# Patient Record
Sex: Female | Born: 1985 | Race: Black or African American | Hispanic: No | Marital: Married | Smoking: Never smoker
Health system: Southern US, Community
[De-identification: ages and names within clinical notes are randomized; demographics above are authoritative.]

## PROBLEM LIST (undated history)

## (undated) ENCOUNTER — Inpatient Hospital Stay (HOSPITAL_COMMUNITY): Payer: Self-pay

## (undated) DIAGNOSIS — D649 Anemia, unspecified: Secondary | ICD-10-CM

## (undated) DIAGNOSIS — N390 Urinary tract infection, site not specified: Secondary | ICD-10-CM

## (undated) DIAGNOSIS — O0930 Supervision of pregnancy with insufficient antenatal care, unspecified trimester: Secondary | ICD-10-CM

## (undated) DIAGNOSIS — Z8619 Personal history of other infectious and parasitic diseases: Secondary | ICD-10-CM

## (undated) DIAGNOSIS — D219 Benign neoplasm of connective and other soft tissue, unspecified: Secondary | ICD-10-CM

## (undated) HISTORY — DX: Supervision of pregnancy with insufficient antenatal care, unspecified trimester: O09.30

## (undated) HISTORY — PX: NO PAST SURGERIES: SHX2092

## (undated) HISTORY — DX: Benign neoplasm of connective and other soft tissue, unspecified: D21.9

## (undated) HISTORY — DX: Personal history of other infectious and parasitic diseases: Z86.19

## (undated) HISTORY — DX: Anemia, unspecified: D64.9

---

## 2008-06-30 ENCOUNTER — Ambulatory Visit: Payer: Self-pay | Admitting: Nurse Practitioner

## 2008-06-30 DIAGNOSIS — R5383 Other fatigue: Secondary | ICD-10-CM

## 2008-06-30 DIAGNOSIS — R5381 Other malaise: Secondary | ICD-10-CM

## 2008-06-30 DIAGNOSIS — L740 Miliaria rubra: Secondary | ICD-10-CM

## 2008-06-30 DIAGNOSIS — J309 Allergic rhinitis, unspecified: Secondary | ICD-10-CM | POA: Insufficient documentation

## 2008-06-30 DIAGNOSIS — J45909 Unspecified asthma, uncomplicated: Secondary | ICD-10-CM | POA: Insufficient documentation

## 2008-06-30 DIAGNOSIS — K219 Gastro-esophageal reflux disease without esophagitis: Secondary | ICD-10-CM

## 2008-07-01 ENCOUNTER — Encounter (INDEPENDENT_AMBULATORY_CARE_PROVIDER_SITE_OTHER): Payer: Self-pay | Admitting: Nurse Practitioner

## 2008-07-01 DIAGNOSIS — D649 Anemia, unspecified: Secondary | ICD-10-CM | POA: Insufficient documentation

## 2008-07-01 LAB — CONVERTED CEMR LAB
HCT: 34 % — ABNORMAL LOW (ref 36.0–46.0)
MCHC: 35 g/dL (ref 30.0–36.0)
MCV: 85 fL (ref 78.0–100.0)
Platelets: 262 10*3/uL (ref 150–400)
RDW: 12.4 % (ref 11.5–15.5)
Retic Ct Pct: 1.3 % (ref 0.4–3.1)
WBC: 4.9 10*3/uL (ref 4.0–10.5)

## 2008-08-04 ENCOUNTER — Ambulatory Visit: Payer: Self-pay | Admitting: Nurse Practitioner

## 2008-08-04 DIAGNOSIS — L299 Pruritus, unspecified: Secondary | ICD-10-CM | POA: Insufficient documentation

## 2008-08-12 ENCOUNTER — Telehealth (INDEPENDENT_AMBULATORY_CARE_PROVIDER_SITE_OTHER): Payer: Self-pay | Admitting: Nurse Practitioner

## 2008-11-14 ENCOUNTER — Ambulatory Visit: Payer: Self-pay | Admitting: Nurse Practitioner

## 2008-11-14 ENCOUNTER — Encounter (INDEPENDENT_AMBULATORY_CARE_PROVIDER_SITE_OTHER): Payer: Self-pay | Admitting: Nurse Practitioner

## 2008-11-14 DIAGNOSIS — H6690 Otitis media, unspecified, unspecified ear: Secondary | ICD-10-CM | POA: Insufficient documentation

## 2008-11-14 DIAGNOSIS — R81 Glycosuria: Secondary | ICD-10-CM

## 2008-11-14 LAB — CONVERTED CEMR LAB
Basophils Absolute: 0.1 10*3/uL (ref 0.0–0.1)
Bilirubin Urine: NEGATIVE
Eosinophils Absolute: 0.2 10*3/uL (ref 0.0–0.7)
Eosinophils Relative: 3 % (ref 0–5)
GC Probe Amp, Genital: NEGATIVE
HCT: 34.7 % — ABNORMAL LOW (ref 36.0–46.0)
Hemoglobin: 12.2 g/dL (ref 12.0–15.0)
Ketones, urine, test strip: NEGATIVE
Lymphocytes Relative: 44 % (ref 12–46)
Lymphs Abs: 2.9 10*3/uL (ref 0.7–4.0)
MCV: 84.4 fL (ref 78.0–100.0)
Monocytes Absolute: 0.4 10*3/uL (ref 0.1–1.0)
Nitrite: NEGATIVE
RDW: 12.3 % (ref 11.5–15.5)
Specific Gravity, Urine: <1.005
Urobilinogen, UA: 0.2
WBC Urine, dipstick: NEGATIVE

## 2008-11-16 ENCOUNTER — Encounter (INDEPENDENT_AMBULATORY_CARE_PROVIDER_SITE_OTHER): Payer: Self-pay | Admitting: Nurse Practitioner

## 2008-11-17 ENCOUNTER — Encounter (INDEPENDENT_AMBULATORY_CARE_PROVIDER_SITE_OTHER): Payer: Self-pay | Admitting: Nurse Practitioner

## 2009-06-02 ENCOUNTER — Ambulatory Visit: Payer: Self-pay | Admitting: Nurse Practitioner

## 2009-06-02 LAB — CONVERTED CEMR LAB
Blood in Urine, dipstick: NEGATIVE
Nitrite: NEGATIVE
Protein, U semiquant: NEGATIVE
WBC Urine, dipstick: NEGATIVE

## 2009-06-07 ENCOUNTER — Ambulatory Visit: Payer: Self-pay | Admitting: *Deleted

## 2011-11-05 ENCOUNTER — Emergency Department (HOSPITAL_COMMUNITY)
Admission: EM | Admit: 2011-11-05 | Discharge: 2011-11-05 | Disposition: A | Discharge status: Home / Self Care | Payer: Self-pay | Attending: Emergency Medicine | Admitting: Emergency Medicine

## 2011-11-05 ENCOUNTER — Emergency Department (HOSPITAL_COMMUNITY): Payer: Self-pay

## 2011-11-05 DIAGNOSIS — R110 Nausea: Secondary | ICD-10-CM | POA: Insufficient documentation

## 2011-11-05 DIAGNOSIS — R5381 Other malaise: Secondary | ICD-10-CM | POA: Insufficient documentation

## 2011-11-05 DIAGNOSIS — R51 Headache: Secondary | ICD-10-CM | POA: Insufficient documentation

## 2011-11-05 DIAGNOSIS — R7309 Other abnormal glucose: Secondary | ICD-10-CM | POA: Insufficient documentation

## 2011-11-05 DIAGNOSIS — N390 Urinary tract infection, site not specified: Secondary | ICD-10-CM | POA: Insufficient documentation

## 2011-11-05 DIAGNOSIS — M25559 Pain in unspecified hip: Secondary | ICD-10-CM | POA: Insufficient documentation

## 2011-11-05 DIAGNOSIS — R509 Fever, unspecified: Secondary | ICD-10-CM | POA: Insufficient documentation

## 2011-11-05 DIAGNOSIS — R000 Tachycardia, unspecified: Secondary | ICD-10-CM | POA: Insufficient documentation

## 2011-11-05 LAB — COMPREHENSIVE METABOLIC PANEL
AST: 24 U/L (ref 0–37)
Albumin: 3.8 g/dL (ref 3.5–5.2)
Calcium: 9.4 mg/dL (ref 8.4–10.5)
Creatinine, Ser: 0.77 mg/dL (ref 0.50–1.10)

## 2011-11-05 LAB — PREGNANCY, URINE: Preg Test, Ur: NEGATIVE

## 2011-11-05 LAB — DIFFERENTIAL
Basophils Absolute: 0 10*3/uL (ref 0.0–0.1)
Basophils Relative: 0 % (ref 0–1)
Eosinophils Relative: 0 % (ref 0–5)
Monocytes Absolute: 1.8 10*3/uL — ABNORMAL HIGH (ref 0.1–1.0)
Neutro Abs: 9.7 10*3/uL — ABNORMAL HIGH (ref 1.7–7.7)

## 2011-11-05 LAB — URINALYSIS, ROUTINE W REFLEX MICROSCOPIC
Glucose, UA: >1000 mg/dL — AB
Protein, ur: 30 mg/dL — AB
pH: 6 (ref 5.0–8.0)

## 2011-11-05 LAB — URINE MICROSCOPIC-ADD ON

## 2011-11-05 LAB — CBC
HCT: 30.6 % — ABNORMAL LOW (ref 36.0–46.0)
MCHC: 35.6 g/dL (ref 30.0–36.0)
MCV: 86.2 fL (ref 78.0–100.0)
RDW: 12.8 % (ref 11.5–15.5)

## 2011-11-05 MED ORDER — ALBUTEROL SULFATE HFA 108 (90 BASE) MCG/ACT IN AERS
2.0000 | INHALATION_SPRAY | Freq: Once | RESPIRATORY_TRACT | Status: AC
  Administered 2011-11-05: 2 via RESPIRATORY_TRACT
  Filled 2011-11-05: qty 6.7

## 2011-11-05 MED ORDER — LIDOCAINE HCL (PF) 1 % IJ SOLN
INTRAMUSCULAR | Status: AC
  Administered 2011-11-05: 5 mL
  Filled 2011-11-05: qty 5

## 2011-11-05 MED ORDER — HYDROCODONE-ACETAMINOPHEN 5-325 MG PO TABS: 2.0000 | ORAL_TABLET | ORAL | Status: AC | PRN

## 2011-11-05 MED ORDER — ONDANSETRON HCL 4 MG PO TABS: 4.0000 mg | ORAL_TABLET | Freq: Four times a day (QID) | ORAL | Status: AC

## 2011-11-05 MED ORDER — CEPHALEXIN 500 MG PO CAPS: 500.0000 mg | ORAL_CAPSULE | Freq: Four times a day (QID) | ORAL | Status: AC

## 2011-11-05 MED ORDER — CEFTRIAXONE SODIUM 1 G IJ SOLR
1.0000 g | Freq: Once | INTRAMUSCULAR | Status: AC
  Administered 2011-11-05: 1 g via INTRAMUSCULAR
  Filled 2011-11-05: qty 10

## 2011-11-05 NOTE — ED Notes (Signed)
Called patient in waiting room no answer.  

## 2011-11-05 NOTE — ED Notes (Signed)
Patient ambulated to X-ray 

## 2011-11-05 NOTE — ED Provider Notes (Signed)
History     CSN: [id number] Arrival date & time: 11/05/2011  2:29 PM   First MD Initiated Contact with Patient 11/05/11 1923      Chief Complaint  Patient presents with  . Hip Pain    (Consider location/radiation/quality/duration/timing/severity/associated sxs/prior treatment) HPI Comments: Patient here with multiple complaints - states that starting last week she began to feel chills, states thatt then she felt like there was something moving all around her body, states headache, dizziness, and hip pain - states that she has a remote history of Malaria from [place] but would like to be tested for "worms" - she does not know how high her fever was.  Denies vomiting, denies seeing worms in stool.  Patient is a 25 y.o. female presenting with hip pain. The history is provided by the patient. No language interpreter was used.  Hip Pain This is a new problem. The current episode started in the past 7 days. The problem occurs intermittently. The problem has been unchanged. Associated symptoms include fatigue, a fever, headaches and nausea. Pertinent negatives include no abdominal pain, arthralgias, chest pain, congestion, coughing, myalgias, neck pain, numbness, sore throat, swollen glands, visual change, vomiting or weakness. The symptoms are aggravated by nothing. She has tried nothing for the symptoms. The treatment provided no relief.    Past Medical History  Diagnosis Date  . Asthma     History reviewed. No pertinent past surgical history.  No family history on file.  History  Substance Use Topics  . Smoking status: Never Smoker   . Smokeless tobacco: Not on file  . Alcohol Use: No    OB History    Grav Para Term Preterm Abortions TAB SAB Ect Mult Living                  Review of Systems  Constitutional: Positive for fever and fatigue.  HENT: Negative for congestion, sore throat and neck pain.   Respiratory: Negative for cough.   Cardiovascular: Negative for chest  pain.  Gastrointestinal: Positive for nausea. Negative for vomiting and abdominal pain.  Musculoskeletal: Negative for myalgias and arthralgias.  Neurological: Positive for headaches. Negative for weakness and numbness.  All other systems reviewed and are negative.    Allergies  Review of patient's allergies indicates no known allergies.  Home Medications   Current Outpatient Rx  Name Route Sig Dispense Refill  . OMEGA-3 FATTY ACIDS 1000 MG PO CAPS Oral Take 1 g by mouth daily.      . ADULT MULTIVITAMIN W/MINERALS CH Oral Take 1 tablet by mouth daily.        BP 111/69  Pulse 114  Temp(Src) 99.5 F (37.5 C) (Oral)  Resp 20  Ht 5\' 3"  (1.6 m)  Wt 120 lb (54.432 kg)  BMI 21.26 kg/m2  SpO2 99%  LMP 10/04/2011  Physical Exam  Nursing note and vitals reviewed. Constitutional: She is oriented to person, place, and time. She appears well-developed and well-nourished. No distress.  HENT:  Head: Normocephalic and atraumatic.  Right Ear: External ear normal.  Left Ear: External ear normal.  Mouth/Throat: Oropharynx is clear and moist. No oropharyngeal exudate.  Eyes: Conjunctivae are normal. Pupils are equal, round, and reactive to light. No scleral icterus.  Neck: Normal range of motion. Neck supple.  Cardiovascular: Normal heart sounds and intact distal pulses.  Exam reveals no gallop and no friction rub.   No murmur heard.      tachycardia  Pulmonary/Chest: Effort normal and breath sounds  normal. No respiratory distress. She has no wheezes.  Abdominal: Soft. Bowel sounds are normal. She exhibits no distension. There is no tenderness.  Musculoskeletal: Normal range of motion.  Lymphadenopathy:    She has no cervical adenopathy.  Neurological: She is alert and oriented to person, place, and time. No cranial nerve deficit.  Skin: Skin is warm and dry.  Psychiatric: She has a normal mood and affect. Her behavior is normal. Judgment and thought content normal.    ED Course    Procedures (including critical care time)  Labs Reviewed  CBC - Abnormal; Notable for the following:    WBC 13.4 (*)    RBC 3.55 (*)    Hemoglobin 10.9 (*)    HCT 30.6 (*)    All other components within normal limits  DIFFERENTIAL - Abnormal; Notable for the following:    Neutro Abs 9.7 (*)    Monocytes Relative 13 (*)    Monocytes Absolute 1.8 (*)    All other components within normal limits  COMPREHENSIVE METABOLIC PANEL - Abnormal; Notable for the following:    Potassium 3.2 (*)    Glucose, Bld 152 (*)    All other components within normal limits  URINALYSIS, ROUTINE W REFLEX MICROSCOPIC - Abnormal; Notable for the following:    APPearance HAZY (*)    Glucose, UA >1000 (*)    Hgb urine dipstick MODERATE (*)    Ketones, ur 15 (*)    Protein, ur 30 (*)    Leukocytes, UA LARGE (*)    All other components within normal limits  URINE MICROSCOPIC-ADD ON - Abnormal; Notable for the following:    Squamous Epithelial / LPF FEW (*)    Bacteria, UA FEW (*)    All other components within normal limits  LIPASE, BLOOD  PREGNANCY, URINE  URINE CULTURE   Dg Abd Acute W/chest  11/05/2011  *RADIOLOGY REPORT*  Clinical Data: Abdominal pain.  ACUTE ABDOMEN SERIES (ABDOMEN 2 VIEW & CHEST 1 VIEW)  Comparison:  None.  Findings:  There is no evidence of dilated bowel loops or free intraperitoneal air.  No radiopaque calculi or other significant radiographic abnormality is seen. Heart size and mediastinal contours are within normal limits.  Both lungs are clear.  IMPRESSION: Negative abdominal radiographs.  No acute cardiopulmonary disease.  Original Report Authenticated By: Reola Calkins, M.D.     UTI  Fever hyperglycemia    MDM  Patient reports feeling better now that fever is down - remains slighly tachycardic.  Though I have a low suspicion for malaria, since she is from an endemic area, I will send the malaria smear.  I also have a low suspicion for worms but will order an ova and  parasite for outpatient on the patient.  Patient also noted to have high blood sugar and glucose in urine, ? DM - she will follow up with PCP this coming week to make sure her sugars are down.  She also has a UTI which is more likely the cause to the fever, chills, nausea and headaches, I have sent a culture on this and will begin treatment here.        Izola Price [place], [place] 11/05/11 2255

## 2011-11-05 NOTE — ED Notes (Signed)
Pt. Believes there is something moving around her hips. Having chills. Believes something is moving all over her body, unsteady gait, nauseated,. Headache, dizziness, tight throat.

## 2011-11-06 LAB — MALARIA SMEAR: Special Requests: NORMAL

## 2011-11-06 NOTE — ED Provider Notes (Signed)
Medical screening examination/treatment/procedure(s) were conducted as a shared visit with non-physician practitioner(s) and myself.  I personally evaluated the patient during the encounter.  Seen by me. Patient is nontoxic. No acute abdomen. Agree with malaria smear and assessment for ova and parasite  Donnetta Hutching, MD 11/06/11 1357

## 2011-11-07 LAB — URINE CULTURE: Culture  Setup Time: 201212182150

## 2011-11-08 NOTE — ED Notes (Signed)
+   Urine Patient treated with Keflex-sensitive to same-chart appended per protocol MD. 

## 2012-03-10 ENCOUNTER — Inpatient Hospital Stay (HOSPITAL_COMMUNITY)
Admission: AD | Admit: 2012-03-10 | Discharge: 2012-03-10 | Disposition: A | Discharge status: Home / Self Care | Payer: PRIVATE HEALTH INSURANCE | Source: Ambulatory Visit | Attending: Obstetrics & Gynecology | Admitting: Obstetrics & Gynecology

## 2012-03-10 ENCOUNTER — Encounter (HOSPITAL_COMMUNITY): Payer: Self-pay | Admitting: *Deleted

## 2012-03-10 DIAGNOSIS — Z3201 Encounter for pregnancy test, result positive: Secondary | ICD-10-CM | POA: Insufficient documentation

## 2012-03-10 DIAGNOSIS — Z3400 Encounter for supervision of normal first pregnancy, unspecified trimester: Secondary | ICD-10-CM

## 2012-03-10 MED ORDER — PRENAT VIT-FEPOLY-METHYLFOL-FA 29-1.13-0.4 MG PO TABS: 1.0000 | ORAL_TABLET | ORAL | Status: DC

## 2012-03-10 NOTE — Discharge Instructions (Signed)
Prenatal Care [facility] OB/GYN    [facility] OB/GYN  & Infertility  [phone]     Phone: [phone]          [facility]                      [facility]  @[place]     Phone: [phone]  Phone: [phone]         [facility]     Phone: [phone]  Phone: [phone]           [facility] @ [place]                hone: [phone]  Phone: [phone]         [facility] [address] Ceo      Phone: [phone]  Phone: [phone]         [facility] Dept.                Phone: [phone]  [facility]   Phone:[phone]    Family [address] [place])          Phone: [phone] [facility] &Infertility       ________________________________________     To schedule your Maternity Eligibility Appointment, please call [phone].  When you arrive for your appointment you must bring the following items or information listed below.  Your appointment will be rescheduled if you do not have these items or are 15 minutes late. If currently receiving Medicaid, you MUST bring: 1. Medicaid Card 2. Social Security Card 3. Picture ID 4. Proof of Pregnancy 5. Verification of current address if the address on Medicaid card is incorrect "postmarked mail" If not receiving Medicaid, you MUST bring: 1. Social Security Card 2. Picture ID 3. Birth Certificate (if available) Passport or *Green Card 4. Proof of Pregnancy 5. Verification of current address "postmarked mail" for each income presented. 6. Verification of insurance coverage, if any 7. Check stubs from each employer for the previous month (if unable to present check stub  for each week, we will accept check stub for the first and last week ill the same month.) If you can't locate check stubs, you must bring a letter from the employer(s) and it  must have the following information on letterhead, typed, in English: o name of company o company telephone number o how long been with the company, if less than one month o how much person earns per hour o how many hours per week work o the gross pay the person earned for the previous month If you are 24 years old or less, you do not have to bring proof of income unless you work or live with the father of the baby and at that time we will need proof of income from you and/or the father of the baby. Green Card recipients are eligible for Medicaid for Pregnant Women (MPW)   Phone: [phone]

## 2012-03-10 NOTE — MAU Note (Signed)
Patient states she has had a positive home pregnancy test and wants to have confirmation and how many weeks she would be. Patient denies any pain or bleeding.

## 2012-03-10 NOTE — MAU Provider Note (Signed)
Summit Ojoamoo26 y.o.G1P0 @[redacted]w[redacted]d  by LMP Chief Complaint  Patient presents with  . Possible Pregnancy     First Provider Initiated Contact with Patient 03/10/12 1338      SUBJECTIVE  HPI: Wants pregnancy verification. No health problems or c/o.  Past Medical History  Diagnosis Date  . Asthma    History reviewed. No pertinent past surgical history. History   Social History  . Marital Status: Single    Spouse Name: N/A    Number of Children: N/A  . Years of Education: N/A   Occupational History  . Not on file.   Social History Main Topics  . Smoking status: Never Smoker   . Smokeless tobacco: Not on file  . Alcohol Use: No  . Drug Use: No  . Sexually Active: Yes    Birth Control/ Protection: None   Other Topics Concern  . Not on file   Social History Narrative  . No narrative on file   No current facility-administered medications on file prior to encounter.   Current Outpatient Prescriptions on File Prior to Encounter  Medication Sig Dispense Refill  . fish oil-omega-3 fatty acids 1000 MG capsule Take 1 g by mouth daily.         No Known Allergies  ROS: Pertinent items in HPI  OBJECTIVE Blood pressure 101/55, pulse 75, temperature 97.9 F (36.6 C), temperature source Oral, resp. rate 16, height 5' 1.75" (1.568 m), weight 53.343 kg (117 lb 9.6 oz), last menstrual period 02/03/2012, SpO2 100.00%. GENERAL: Well-developed, well-nourished female in no acute distress.  ABDOMEN: Soft, nontender  Exam deferred  LAB RESULTS  Results for orders placed during the hospital encounter of 03/10/12 (from the past 24 hour(s))  POCT PREGNANCY, URINE     Status: Abnormal   Collection Time   03/10/12  1:31 PM      Component Value Range   Preg Test, Ur POSITIVE (*) NEGATIVE       ASSESSMENT G1 at 5+ weeks pregnant   PLAN Pregnancy precautions, prenatal vitamins, information on getting Medicaid and prenatal care, verification letter given     Daylen Hack  03/10/2012 1:55 PM

## 2012-03-27 ENCOUNTER — Encounter (HOSPITAL_COMMUNITY): Payer: Self-pay | Admitting: *Deleted

## 2012-03-27 ENCOUNTER — Inpatient Hospital Stay (HOSPITAL_COMMUNITY): Payer: PRIVATE HEALTH INSURANCE

## 2012-03-27 ENCOUNTER — Inpatient Hospital Stay (HOSPITAL_COMMUNITY)
Admission: AD | Admit: 2012-03-27 | Discharge: 2012-03-27 | Disposition: A | Discharge status: Home / Self Care | Payer: PRIVATE HEALTH INSURANCE | Source: Ambulatory Visit | Attending: Obstetrics and Gynecology | Admitting: Obstetrics and Gynecology

## 2012-03-27 DIAGNOSIS — O209 Hemorrhage in early pregnancy, unspecified: Secondary | ICD-10-CM

## 2012-03-27 HISTORY — DX: Anemia, unspecified: D64.9

## 2012-03-27 HISTORY — DX: Urinary tract infection, site not specified: N39.0

## 2012-03-27 LAB — WET PREP, GENITAL: Trich, Wet Prep: NONE SEEN

## 2012-03-27 NOTE — Discharge Instructions (Signed)
Begin prenatal care as soon as possible. Drink at least 8 8-oz glasses of water every day. Take Tylenol 325 mg 2 tablets by mouth every 4 hours if needed for pain.  Prenatal Care [facility] OB[facility]N    [facility] OB/GYN  & Infertility  [phone]     Phone: [phone]          [facility]                      [facility] [place]  @[place]     Phone: [phone]  Phone: [phone]         [facility]     Phone: [phone]  Phone: [phone]           [facility] @ [place]                hone: [phone]  Phone: [phone]         [facility] [address]      Phone: [phone]  Phone: [phone]         [facility] Dept.                Phone: [phone]  [facility]   Phone:[phone]    Family [address] [place])          Phone: [phone] [facility] OB[facility] &Infertility   Phone: [phone]       ________________________________________     To schedule your Maternity Eligibility Appointment, please call [phone].  When you arrive for your appointment you must bring the following items or information listed below.  Your appointment will be rescheduled if you do not have these items or are 15 minutes late. If currently receiving Medicaid, you MUST bring: 1. Medicaid Card 2. Social Security Card 3. Picture ID 4. Proof of Pregnancy 5. Verification of current address if the address on Medicaid card is incorrect "postmarked mail" If not receiving Medicaid, you MUST bring: 1. Social Security Card 2. Picture ID 3. Birth Certificate (if available) Passport or *Green Card 4. Proof of Pregnancy 5. Verification of current address "postmarked mail" for each income presented. 6. Verification of insurance coverage, if any 7. Check stubs from each employer for the previous month (if unable to present  check stub  for each week, we will accept check stub for the first and last week ill the same month.) If you can't locate check stubs, you must bring a letter from the employer(s) and it must have the following information on letterhead, typed, in English: o name of company o company telephone number o how long been with the company, if less than one month o how much person earns per hour o how many hours per week work o the gross pay the person earned for the previous month If you are 24 years old or less, you do not have to bring proof of income unless you work or live with the father of the baby and at that time we will need proof of income from you and/or the father of the baby. Green Card recipients are eligible for Medicaid for Pregnant Women (MPW)

## 2012-03-27 NOTE — MAU Provider Note (Signed)
Chief Complaint: Vaginal spotting    First Provider Initiated Contact with Patient 03/27/12 1824      Christine Taylor is  26 y.o. G1P0.  Patient's last menstrual period was 02/03/2012.Marland Kitchen  [redacted]w[redacted]d by LMP  She presents complaining of vaginal spotting.  Onset is described as sudden, single episode on tissue after voiding and has been present for  1-2  hours. Denies abd pain, dysuria, back pain, fever, chills, nausea or vomiting.  Obstetrical/Gynecological History: OB History    Grav Para Term Preterm Abortions TAB SAB Ect Mult Living   1               Past Medical History: Past Medical History  Diagnosis Date  . Asthma     as chlid  . Anemia   . Urinary tract infection     Past Surgical History: Past Surgical History  Procedure Date  . No past surgeries     Family History: Family History  Problem Relation Age of Onset  . Anesthesia problems Neg Hx     Social History: History  Substance Use Topics  . Smoking status: Never Smoker   . Smokeless tobacco: Never Used  . Alcohol Use: No    Allergies: No Known Allergies  No prescriptions prior to admission    Review of Systems - Negative except what has been reviewed in [place]  Physical Exam   Blood pressure 107/59, pulse 76, temperature 100 F (37.8 C), temperature source Oral, resp. rate 18, height 5\' 2"  (1.575 m), weight 120 lb 4 oz (54.545 kg), last menstrual period 02/03/2012.  General: General appearance - alert, well appearing, and in no distress and oriented to person, place, and time Mental status - alert, oriented to person, place, and time, normal mood, behavior, speech, dress, motor activity, and thought processes, affect appropriate to mood Abdomen - soft, nontender, nondistended, no masses or organomegaly Focused Gynecological Exam: VULVA: normal appearing vulva with no masses, tenderness or lesions, VAGINA: vaginal discharge - mucoid and brown and red, CERVIX: normal appearing cervix without discharge or  lesions, UTERUS: enlarged, non tender, ADNEXA: normal adnexa in size, nontender and no masses  Labs: Recent Results (from the past 24 hour(s))  WET PREP, GENITAL   Collection Time   03/27/12  6:37 PM      Component Value Range   Yeast Wet Prep HPF POC NONE SEEN  NONE SEEN    Trich, Wet Prep NONE SEEN  NONE SEEN    Clue Cells Wet Prep HPF POC NONE SEEN  NONE SEEN    WBC, Wet Prep HPF POC RARE (*) NONE SEEN    Imaging Studies:  Clinical Data: Spotting in a pregnant patient.  OBSTETRIC <14 WK [place] AND TRANSVAGINAL OB US  Technique: Both transabdominal and transvaginal ultrasound  examinations were performed for complete evaluation of the  gestation as well as the maternal uterus, adnexal regions, and  pelvic cul-de-sac. Transvaginal technique was performed to assess  early pregnancy.  Comparison: None.  Intrauterine gestational sac: Visualized/normal in shape.  Yolk sac: Visualized.  Embryo: Visualized.  Cardiac Activity: Detected.  Heart Rate: 83 bpm  CRL: 5.1 mm 6 w 2 d [place] EDC: 11/18/2012  Maternal uterus/adnexae:  Corpus luteum cyst on the right noted. No free pelvic fluid.  IMPRESSION:  Single living intrauterine pregnancy without evidence of  complication.    Assessment: Bleeding in pregnancy  Plan: Report given and care assumed by Nadene Rubins, NP. 8:01 PM  Begin prenatal care as soon as possible.  Drink at least 8 8-oz glasses of water every day. Take Tylenol 325 mg 2 tablets by mouth every 4 hours if needed for pain.  SHORES,SUZANNE E. 03/27/2012,8:01 PM

## 2012-03-28 LAB — GC/CHLAMYDIA PROBE AMP, GENITAL
Chlamydia, DNA Probe: NEGATIVE
GC Probe Amp, Genital: NEGATIVE

## 2012-03-29 ENCOUNTER — Encounter (HOSPITAL_COMMUNITY): Payer: Self-pay | Admitting: *Deleted

## 2012-03-29 ENCOUNTER — Inpatient Hospital Stay (HOSPITAL_COMMUNITY)
Admission: AD | Admit: 2012-03-29 | Discharge: 2012-03-30 | Disposition: A | Discharge status: Home / Self Care | Payer: PRIVATE HEALTH INSURANCE | Source: Ambulatory Visit | Attending: Obstetrics and Gynecology | Admitting: Obstetrics and Gynecology

## 2012-03-29 DIAGNOSIS — O039 Complete or unspecified spontaneous abortion without complication: Secondary | ICD-10-CM | POA: Insufficient documentation

## 2012-03-29 NOTE — MAU Note (Signed)
Pt reports she was seen 2 days ago for bleeding and at that time it was only spotting, now she states she is bleeding like a period and having cramping.

## 2012-03-30 ENCOUNTER — Inpatient Hospital Stay (HOSPITAL_COMMUNITY): Payer: PRIVATE HEALTH INSURANCE

## 2012-03-30 NOTE — Discharge Instructions (Signed)
SOMEONE WILL CALL YOU FROM THE GYN CLINIC TO SCHEDULE A FOLLOW UP APPOINTMENT FOR 2 WEEKS. RETURN HERE FOR ANY PROBLEMS SUCH AS FEVER, HEAVY BLEEDING OR SEVERE PAIN.

## 2012-03-30 NOTE — MAU Note (Signed)
Patient discharge home. Comfort care given. Poem and heart given prior to discharge.

## 2012-03-30 NOTE — MAU Provider Note (Signed)
History     CSN: [id number]  Arrival date & time 03/29/12  2317   None     Chief Complaint  Patient presents with  . Vaginal Bleeding    HPI Christine Taylor is a 26 y.o. female @ [redacted]w[redacted]d gestation who presents to MAU for vaginal bleeding. She was evaluated 2 days ago for spotting in early pregnancy and had a viable IUP. Tonight she reports that the bleeding increased and she had cramping and passed clots. The symptoms have improved now. The history was provided by the patient.  Past Medical History  Diagnosis Date  . Asthma     as chlid  . Anemia   . Urinary tract infection     Past Surgical History  Procedure Date  . No past surgeries     Family History  Problem Relation Age of Onset  . Anesthesia problems Neg Hx     History  Substance Use Topics  . Smoking status: Never Smoker   . Smokeless tobacco: Never Used  . Alcohol Use: No    OB History    Grav Para Term Preterm Abortions TAB SAB Ect Mult Living   1               Review of Systems  Constitutional: Negative for fever, chills and appetite change.  HENT: Negative.   Eyes: Negative.   Respiratory: Negative.   Gastrointestinal: Positive for nausea and abdominal pain.  Genitourinary: Positive for vaginal bleeding.  Skin: Negative.   Psychiatric/Behavioral: Negative for confusion. The patient is not nervous/anxious.     Allergies  Review of patient's allergies indicates no known allergies.  Home Medications  No current outpatient prescriptions on file.  BP 101/62  Pulse 87  Temp(Src) 97 F (36.1 C) (Oral)  Resp 18  Ht 5\' 2"  (1.575 m)  Wt 121 lb (54.885 kg)  BMI 22.13 kg/m2  LMP 02/03/2012  Physical Exam  Nursing note and vitals reviewed. Constitutional: She is oriented to person, place, and time. She appears well-developed and well-nourished. No distress.  HENT:  Head: Normocephalic.  Eyes: EOM are normal.  Neck: Neck supple.  Cardiovascular: Normal rate.   Pulmonary/Chest: Effort normal.   Abdominal: Soft. There is no tenderness.  Musculoskeletal: Normal range of motion.  Neurological: She is alert and oriented to person, place, and time. No cranial nerve deficit.  Skin: Skin is warm and dry.  Psychiatric: She has a normal mood and affect. Her behavior is normal. Judgment and thought content normal.   US Ob Transvaginal  03/30/2012  *RADIOLOGY REPORT*  Clinical Data: Bleeding.  Cramping.  TRANSVAGINAL OB ULTRASOUND  Technique:  Transvaginal ultrasound was performed for evaluation of the gestation as well as the maternal uterus and adnexal regions.  Comparison: 03/27/2012  Findings: Previously seen intrauterine gestational sac is no longer present.  Endometrium measures up to 1.4 cm in thickness, with heterogeneity most compatible with blood products.  A trace amount of free pelvic fluid is present posterior to the uterus.  The left ovary measures 1.5 x 2.9 x 1.8 cm and the right ovary measures 2.2 x 3.7 x 2.2 cm and contains a corpus luteum cyst.  A low echogenicity structure along the posterior margin of the uterus is observed, and may represent a region of vascularity or conceivably a small posterior fibroid.  IMPRESSION:  1.  Previously seen intrauterine gestational sac is no longer present, and there are blood products in the endometrial canal, compatible with spontaneous abortion. 2.  Low echogenicity  structure along the posterior margin of the uterus is indistinctly seen, and may represent a region of vascularity or conceivably a small fibroid. 3.  Trace free pelvic fluid.  Original Report Authenticated By: Dellia Cloud, M.D.   Assessment: SAB  Plan:  Follow up with GYN Clinic in 2 weeks.   Return here as needed.    I have reviewed this patient's vital signs, nurses notes, appropriate labs and imaging. I have discussed the finding in detail with the patient and plan of care. Patient voices understanding.    ED Course  Procedures   MDM

## 2012-04-07 ENCOUNTER — Encounter (HOSPITAL_COMMUNITY): Payer: Self-pay | Admitting: *Deleted

## 2012-04-07 ENCOUNTER — Inpatient Hospital Stay (HOSPITAL_COMMUNITY)
Admission: AD | Admit: 2012-04-07 | Discharge: 2012-04-07 | Disposition: A | Discharge status: Home / Self Care | Payer: PRIVATE HEALTH INSURANCE | Source: Ambulatory Visit | Attending: Family Medicine | Admitting: Family Medicine

## 2012-04-07 DIAGNOSIS — R5381 Other malaise: Secondary | ICD-10-CM | POA: Insufficient documentation

## 2012-04-07 DIAGNOSIS — D649 Anemia, unspecified: Secondary | ICD-10-CM

## 2012-04-07 DIAGNOSIS — R5383 Other fatigue: Secondary | ICD-10-CM | POA: Insufficient documentation

## 2012-04-07 LAB — CBC
HCT: 28.7 % — ABNORMAL LOW (ref 36.0–46.0)
MCH: 30.3 pg (ref 26.0–34.0)
MCHC: 35.2 g/dL (ref 30.0–36.0)
MCV: 86.2 fL (ref 78.0–100.0)
RDW: 12.5 % (ref 11.5–15.5)

## 2012-04-07 NOTE — Discharge Instructions (Signed)
Anemia, Nonspecific Your exam and blood tests show you are anemic. This means your blood (hemoglobin) level is low. Normal hemoglobin values are 12 to 15 g/dL for females and 14 to 17 g/dL for males. Make a note of your hemoglobin level today. The hematocrit percent is also used to measure anemia. A normal hematocrit is 38% to 46% in females and 42% to 49% in males. Make a note of your hematocrit level today. CAUSES  Anemia can be due to many different causes.  Excessive bleeding from periods (in women).   Intestinal bleeding.   Poor nutrition.   Kidney, thyroid, liver, and bone marrow diseases.  SYMPTOMS  Anemia can come on suddenly (acute). It can also come on slowly. Symptoms can include:  Minor weakness.   Dizziness.   Palpitations.   Shortness of breath.  Symptoms may be absent until half your hemoglobin is missing if it comes on slowly. Anemia due to acute blood loss from an injury or internal bleeding may require blood transfusion if the loss is severe. Hospital care is needed if you are anemic and there is significant continual blood loss. TREATMENT   Stool tests for blood (Hemoccult) and additional lab tests are often needed. This determines the best treatment.   Further checking on your condition and your response to treatment is very important. It often takes many weeks to correct anemia.  Depending on the cause, treatment can include:  Supplements of iron.   Vitamins B12 and folic acid.   Hormone medicines.If your anemia is due to bleeding, finding the cause of the blood loss is very important. This will help avoid further problems.  SEEK IMMEDIATE MEDICAL CARE IF:   You develop fainting, extreme weakness, shortness of breath, or chest pain.   You develop heavy vaginal bleeding.   You develop bloody or black, tarry stools or vomit up blood.   You develop a high fever, rash, repeated vomiting, or dehydration.  Document Released: 12/12/2004 Document Revised:  10/24/2011 Document Reviewed: 09/19/2009 ExitCare Patient Information 2012 [employer]. 

## 2012-04-07 NOTE — MAU Provider Note (Signed)
History     CSN: [id number]  Arrival date and time: 04/07/12 1516   None     No chief complaint on file.  HPI Christine Taylor is 26 y.o. G1P 0010 presents with fatigue, no energy since yesterday.   Miscarriage 5/14.  She has appointment for followup in the GYN CLINIC on 5/29.  Reports bright red blood only requiring 2 pads per day.  Lower abdominal cramping that is controlled with Motrin 200mg .  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    Past Medical History  Diagnosis Date  . Asthma     as chlid  . Anemia   . Urinary tract infection     Past Surgical History  Procedure Date  . No past surgeries     Family History  Problem Relation Age of Onset  . Anesthesia problems Neg Hx   . Hypertension Father     History  Substance Use Topics  . Smoking status: Never Smoker   . Smokeless tobacco: Never Used  . Alcohol Use: No    Allergies: No Known Allergies  Prescriptions prior to admission  Medication Sig Dispense Refill  . ferrous sulfate 325 (65 FE) MG tablet Take 325 mg by mouth 3 (three) times daily with meals.      Marland Kitchen ibuprofen (ADVIL,MOTRIN) 200 MG tablet Take 200 mg by mouth every 6 (six) hours as needed. For pain        Review of Systems  Constitutional: Positive for malaise/fatigue.  Eyes: Negative.   Cardiovascular: Negative.   Gastrointestinal: Positive for abdominal pain (mild cramping).  Genitourinary:       + for light bleeding  Neurological: Positive for headaches (mild).   Physical Exam   Blood pressure 97/53, pulse 74, temperature 97.2 F (36.2 C), temperature source Oral, resp. rate 16, height 5' 1.5" (1.562 m), weight 54.885 kg (121 lb), last menstrual period 02/03/2012, SpO2 99.00%.  Physical Exam  Constitutional: She is oriented to person, place, and time. She appears well-developed and well-nourished. No distress.  HENT:  Head: Normocephalic.  Neck: Normal range of motion.  Cardiovascular: Normal  rate.   Respiratory: Effort normal.  GI: Soft. She exhibits no mass. There is no tenderness. There is no rebound and no guarding.  Genitourinary: Uterus is not enlarged and not tender. Right adnexum displays no mass, no tenderness and no fullness. Left adnexum displays no mass, no tenderness and no fullness. There is bleeding (small amount of blood mixed with mucous.  Neg for clots and active bleeding) around the vagina.  Neurological: She is alert and oriented to person, place, and time.  Skin: Skin is warm and dry.  Psychiatric: She has a normal mood and affect. Her behavior is normal.    Results for orders placed during the hospital encounter of 04/07/12 (from the past 24 hour(s))  ABO/RH     Status: Normal (Preliminary result)   Collection Time   04/07/12  3:40 PM      Component Value Range   ABO/RH(D) O POS    CBC     Status: Abnormal   Collection Time   04/07/12  3:40 PM      Component Value Range   WBC 5.0  4.0 - 10.5 (K/uL)   RBC 3.33 (*) 3.87 - 5.11 (MIL/uL)   Hemoglobin 10.1 (*) 12.0 - 15.0 (g/dL)   HCT 78.2 (*) 95.6 - 46.0 (%)   MCV 86.2  78.0 - 100.0 (fL)   MCH  30.3  26.0 - 34.0 (pg)   MCHC 35.2  30.0 - 36.0 (g/dL)   RDW 16.1  09.6 - 04.5 (%)   Platelets 227  150 - 400 (K/uL)    MAU Course  Procedures  MDM   Assessment and Plan  A:  S/P Miscarriage on 5/12      Anemia      Fatigue  P:  Instructed to take Ferrous Sulfate 1 tab po qd      Keep scheduled appointment in the GYN CLINIC for 5/29 as planned   Ayan Heffington,EVE M 04/07/2012, 3:41 PM

## 2012-04-08 NOTE — MAU Provider Note (Signed)
Chart reviewed and agree with management and plan.  

## 2012-04-13 NOTE — MAU Provider Note (Signed)
Attestation of Attending Supervision of Advanced Practitioner: Evaluation and management procedures were performed by the PA/NP/CNM/OB Fellow under my supervision/collaboration. Chart reviewed and agree with management and plan.  Tilda Burrow 04/13/2012 8:48 PM

## 2012-04-15 ENCOUNTER — Encounter: Payer: Self-pay | Admitting: Obstetrics and Gynecology

## 2012-04-15 ENCOUNTER — Ambulatory Visit (INDEPENDENT_AMBULATORY_CARE_PROVIDER_SITE_OTHER): Payer: PRIVATE HEALTH INSURANCE | Admitting: Obstetrics and Gynecology

## 2012-04-15 VITALS — BP 106/64 | HR 88 | Temp 97.2°F | Ht 63.0 in | Wt 121.5 lb

## 2012-04-15 DIAGNOSIS — O039 Complete or unspecified spontaneous abortion without complication: Secondary | ICD-10-CM | POA: Insufficient documentation

## 2012-04-15 DIAGNOSIS — Z01419 Encounter for gynecological examination (general) (routine) without abnormal findings: Secondary | ICD-10-CM

## 2012-04-15 NOTE — Patient Instructions (Signed)
Miscarriage (Spontaneous Miscarriage)  A miscarriage is when you lose your baby before the twentieth week of pregnancy. Miscarriages happen in 15-20% of pregnancies. Most miscarriages happen in the first 13 weeks of the pregnancy. In medical terms, this is called a spontaneous miscarriage or early pregnancy loss. No further treatment is needed when the miscarriage is complete and all products of conception have been passed out of the body. You can begin trying for another pregnancy as soon as your caregiver says it is okay.  CAUSES    Most causes are not known.   Genetic problems like abnormal, not enough or too many chromosomes.   Infection of the cervix or uterus.   An abnormal shaped uterus, fibroid tumors or congenital abnormalities.   Hormone problems.   Medical problems.   Incompetent cervix, the tissue in the cervix is not strong enough to hold the pregnancy.   Smoking, too much alcohol use and illegal drugs.   Trauma.  SYMPTOMS    Bleeding or spotting from the vagina.   Cramping of the lower abdomen.   Passing of fluid from the vagina with or without cramps or pain.   Passing fetal tissue.  TREATMENT    Sometimes no further treatment is necessary if you pass all the tissue in the uterus.   If partial parts of the fetus or placenta remain in the body (incomplete miscarriage), tissue left behind may become infected. Usually a D and C (Dilatation and Curettage) suction or scrapping of the uterus is necessary to remove the remaining tissue in uterus. The procedure is only done when your caregiver knows that there is no chance for the pregnancy to continue. This is determined by a physical exam, a negative pregnancy test, blood tests and perhaps an ultrasound revealing a dead fetus or no fetus developing because a problem occurred at conception (when the sperm and egg unite).   Medications may be necessary, antibiotics if there is an infection or medications to contract the uterus if there is a  lot of bleeding.   If you have Rh negative blood and your partner is Rh positive, you will need a Rho-gam shot (an immune globulin vaccine). This will protect your baby from having Rh blood problems in future pregnancies.  HOME CARE INSTRUCTIONS    Your caregiver may order bed rest (up to the bathroom only). He or she may allow you to continue light activity. You may need to make arrangements for the care of children and for any other responsibilities.   Keep track of the number of pads you use each day and how soaked (saturated) they are. Record this information.   Do not use tampons. Do not douche or have sexual intercourse until approved by your caregiver.   Only take over-the-counter or prescription medicines for pain, discomfort or fever as directed by your caregiver.   Do not take aspirin because it can cause bleeding.   It is very important to keep all follow-up appointments for re-evaluations and continuing management.   Tell your caregiver if you are experiencing domestic violence.   Women who have an Rh negative blood type (i.e., A, B, AB, or O negative) need to receive a drug called Rh(D) immune globulin (RhoGam). This medicine helps protect future fetuses against problems that can occur if an Rh negative mother is carrying a baby who is Rh positive.   If you and/or your partner are having problems with guilt or grieving, talk to your caregiver or seek counseling to help   you cope with the pregnancy loss. Allow enough time to grieve before trying to get pregnant again.  SEEK IMMEDIATE MEDICAL CARE IF:    You have severe cramps or pain in your stomach, back, or belly (abdomen).   You have a fever.   You pass large clots or tissue. Save any tissue for your caregiver to inspect.   Your bleeding increases.   You become light-headed, weak or have fainting episodes.   You develop chills.  Document Released: 04/30/2001 Document Revised: 10/24/2011 Document Reviewed: 06/06/2008  ExitCare Patient  Information 2012 [employer].

## 2012-04-15 NOTE — Progress Notes (Signed)
Patient ID: Christine Taylor, female   DOB: 11/21/1985, 26 y.o.   MRN: [id number] Christine Ojoamoo26 y.o.G1P0010 Chief Complaint  Patient presents with  . Follow-up    SAB      SUBJECTIVE  HPI: She presents clinic for a followup appointment following a completed SAB which occurred 03/29/2012. She is doing well emotionally. She is no longer having any spotting or cramping. 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 She would like to get a Mirena IUD   Past Medical History  Diagnosis Date  . Asthma     as chlid  . Anemia   . Urinary tract infection    Past Surgical History  Procedure Date  . No past surgeries    History   Social History  . Marital Status: Single    Spouse Name: N/A    Number of Children: N/A  . Years of Education: N/A   Occupational History  . Not on file.   Social History Main Topics  . Smoking status: Never Smoker   . Smokeless tobacco: Never Used  . Alcohol Use: No  . Drug Use: No  . Sexually Active: Yes    Birth Control/ Protection: None   Other Topics Concern  . Not on file   Social History Narrative  . No narrative on file   Current Outpatient Prescriptions on File Prior to Visit  Medication Sig Dispense Refill  . ferrous sulfate 325 (65 FE) MG tablet Take 325 mg by mouth 3 (three) times daily with meals.      Marland Kitchen ibuprofen (ADVIL,MOTRIN) 200 MG tablet Take 200 mg by mouth every 6 (six) hours as needed. For pain       No Known Allergies  ROS: Pertinent items in HPI  OBJECTIVE Blood pressure 106/64, pulse 88, temperature 97.2 F (36.2 C), temperature source Oral, height 5\' 3"  (1.6 m), weight 121 lb 8 oz (55.112 kg), last menstrual period 02/03/2012.  GENERAL: Well-developed, well-nourished female in no acute distress.  HEENT: Normocephalic, good dentition HEART: normal rate RESP: normal effort ABDOMEN: Soft, nontender EXTREMITIES: Nontender, no edema NEURO: Alert and oriented SPECULUM EXAM: NEFG, physiologic  discharge, no blood noted, cervix clean BIMANUAL: no CMT; uterus NSSP; no adnexal tenderness or masses          ASSESSMENT 26 year old G1 P0010 now 3 weeks status post early SAB doing well Needs contraception   PLAN Pap GC Chlamydia done. Application for Mirena done. Advised to expect menses in one to 3 weeks and to keep a menstrual calendar. Return to GYN clinic in 3 weeks     Christine Taylor 04/15/2012 4:31 PM

## 2012-06-06 ENCOUNTER — Inpatient Hospital Stay (HOSPITAL_COMMUNITY)
Admission: AD | Admit: 2012-06-06 | Discharge: 2012-06-06 | Disposition: A | Discharge status: Home / Self Care | Payer: PRIVATE HEALTH INSURANCE | Source: Ambulatory Visit | Attending: Obstetrics & Gynecology | Admitting: Obstetrics & Gynecology

## 2012-06-06 ENCOUNTER — Encounter (HOSPITAL_COMMUNITY): Payer: Self-pay | Admitting: *Deleted

## 2012-06-06 DIAGNOSIS — Z3202 Encounter for pregnancy test, result negative: Secondary | ICD-10-CM | POA: Insufficient documentation

## 2012-06-06 NOTE — MAU Provider Note (Signed)
Christine Taylor is a 26 y.o. female who presents to MAU for pregnancy test. She had a SAB in May and followed up in the GYN Clinic for a pap smear. She has not had a period this month and wanted to be sure her pregnancy test was negative. She is using condoms for birth control. She is to return to the GYN Clinic to discuss birth control.   BP 102/71  Pulse 69  Temp 97.7 F (36.5 C) (Oral)  Resp 18  Ht 5\' 3"  (1.6 m)  Wt 122 lb 12.8 oz (55.702 kg)  BMI 21.75 kg/m2  LMP 05/02/2012  Breastfeeding? Unknown   Assessment: Negative pregnancy test  Plan:  Follow up with GYN Clinic   Continue condom use.

## 2012-06-06 NOTE — MAU Note (Addendum)
Pt reports she just thought she might be pregnant because she missed her period. Had a Miscarriage in MAY and a regular period in June but none in July Denies any pain or other problems.

## 2012-06-06 NOTE — MAU Provider Note (Signed)
Attestation of Attending Supervision of Advanced Practitioner (CNM/NP): Evaluation and management procedures were performed by the Advanced Practitioner under my supervision and collaboration.  I have reviewed the Advanced Practitioner's note and chart, and I agree with the management and plan.  HARRAWAY-SMITH, Kelsy Polack 7:39 PM     

## 2012-06-15 ENCOUNTER — Ambulatory Visit (INDEPENDENT_AMBULATORY_CARE_PROVIDER_SITE_OTHER): Payer: PRIVATE HEALTH INSURANCE | Admitting: Obstetrics & Gynecology

## 2012-06-15 ENCOUNTER — Encounter: Payer: Self-pay | Admitting: Obstetrics & Gynecology

## 2012-06-15 VITALS — BP 104/62 | HR 73 | Temp 98.0°F | Resp 12 | Ht 63.0 in | Wt 123.7 lb

## 2012-06-15 DIAGNOSIS — D649 Anemia, unspecified: Secondary | ICD-10-CM

## 2012-06-15 DIAGNOSIS — O039 Complete or unspecified spontaneous abortion without complication: Secondary | ICD-10-CM

## 2012-06-15 NOTE — Patient Instructions (Signed)
Contraception Choices Contraception (birth control) is the use of any methods or devices to prevent pregnancy. Below are some methods to help avoid pregnancy. HORMONAL METHODS   Contraceptive implant. This is a thin, plastic tube containing progesterone hormone. It does not contain estrogen hormone. Your caregiver inserts the tube in the inner part of the upper arm. The tube can remain in place for up to 3 years. After 3 years, the implant must be removed. The implant prevents the ovaries from releasing an egg (ovulation), thickens the cervical mucus which prevents sperm from entering the uterus, and thins the lining of the inside of the uterus.   Progesterone-only injections. These injections are given every 3 months by your caregiver to prevent pregnancy. This synthetic progesterone hormone stops the ovaries from releasing eggs. It also thickens cervical mucus and changes the uterine lining. This makes it harder for sperm to survive in the uterus.   Birth control pills. These pills contain estrogen and progesterone hormone. They work by stopping the egg from forming in the ovary (ovulation). Birth control pills are prescribed by a caregiver.Birth control pills can also be used to treat heavy periods.   Minipill. This type of birth control pill contains only the progesterone hormone. They are taken every day of each month and must be prescribed by your caregiver.   Birth control patch. The patch contains hormones similar to those in birth control pills. It must be changed once a week and is prescribed by a caregiver.   Vaginal ring. The ring contains hormones similar to those in birth control pills. It is left in the vagina for 3 weeks, removed for 1 week, and then a new one is put back in place. The patient must be comfortable inserting and removing the ring from the vagina.A caregiver's prescription is necessary.   Emergency contraception. Emergency contraceptives prevent pregnancy after  unprotected sexual intercourse. This pill can be taken right after sex or up to 5 days after unprotected sex. It is most effective the sooner you take the pills after having sexual intercourse. Emergency contraceptive pills are available without a prescription. Check with your pharmacist. Do not use emergency contraception as your only form of birth control.  BARRIER METHODS   Female condom. This is a thin sheath (latex or rubber) that is worn over the penis during sexual intercourse. It can be used with spermicide to increase effectiveness.   Female condom. This is a soft, loose-fitting sheath that is put into the vagina before sexual intercourse.   Diaphragm. This is a soft, latex, dome-shaped barrier that must be fitted by a caregiver. It is inserted into the vagina, along with a spermicidal jelly. It is inserted before intercourse. The diaphragm should be left in the vagina for 6 to 8 hours after intercourse.   Cervical cap. This is a round, soft, latex or plastic cup that fits over the cervix and must be fitted by a caregiver. The cap can be left in place for up to 48 hours after intercourse.   Sponge. This is a soft, circular piece of polyurethane foam. The sponge has spermicide in it. It is inserted into the vagina after wetting it and before sexual intercourse.   Spermicides. These are chemicals that kill or block sperm from entering the cervix and uterus. They come in the form of creams, jellies, suppositories, foam, or tablets. They do not require a prescription. They are inserted into the vagina with an applicator before having sexual intercourse. The process must be   repeated every time you have sexual intercourse.  INTRAUTERINE CONTRACEPTION  Intrauterine device (IUD). This is a T-shaped device that is put in a woman's uterus during a menstrual period to prevent pregnancy. There are 2 types:   Copper IUD. This type of IUD is wrapped in copper wire and is placed inside the uterus. Copper  makes the uterus and fallopian tubes produce a fluid that kills sperm. It can stay in place for 10 years.   Hormone IUD. This type of IUD contains the hormone progestin (synthetic progesterone). The hormone thickens the cervical mucus and prevents sperm from entering the uterus, and it also thins the uterine lining to prevent implantation of a fertilized egg. The hormone can weaken or kill the sperm that get into the uterus. It can stay in place for 5 years.  PERMANENT METHODS OF CONTRACEPTION  Female tubal ligation. This is when the woman's fallopian tubes are surgically sealed, tied, or blocked to prevent the egg from traveling to the uterus.   Female sterilization. This is when the female has the tubes that carry sperm tied off (vasectomy).This blocks sperm from entering the vagina during sexual intercourse. After the procedure, the man can still ejaculate fluid (semen).  NATURAL PLANNING METHODS  Natural family planning. This is not having sexual intercourse or using a barrier method (condom, diaphragm, cervical cap) on days the woman could become pregnant.   Calendar method. This is keeping track of the length of each menstrual cycle and identifying when you are fertile.   Ovulation method. This is avoiding sexual intercourse during ovulation.   Symptothermal method. This is avoiding sexual intercourse during ovulation, using a thermometer and ovulation symptoms.   Post-ovulation method. This is timing sexual intercourse after you have ovulated.  Regardless of which type or method of contraception you choose, it is important that you use condoms to protect against the transmission of sexually transmitted diseases (STDs). Talk with your caregiver about which form of contraception is most appropriate for you. Document Released: 11/04/2005 Document Revised: 10/24/2011 Document Reviewed: 03/13/2011 Exit[employer] Patient Information 2012 [employer]. 

## 2012-06-15 NOTE — Progress Notes (Signed)
Patient ID: Christine Taylor, female   DOB: 1986/06/24, 24 y.o.   MRN: [id number] G1P0 Patient's last menstrual period was 05/02/2012. Patient is using condoms for birth control. She has spontaneous miscarriage in May and she has followed up after that. She is concerned that she is late on this. This had a negative pregnancy test. She has taken iron supplement which she thinks may be causing some constipation and gas. She notes some left upper quadrant left flank pain. No urinary symptoms. I reviewed her recent laboratories including her hemoglobin which was mildly low. Encouraged her to take an iron supplement. She should use condoms along with a spermicide for birth control. Her questions were answered about menstrual irregularities following a miscarriage. She can return for yearly examinations. Her Pap smear in May was normal.  Christine Taylor 06/15/2012 5:02 PM

## 2012-11-18 NOTE — L&D Delivery Note (Signed)
Delivery Note Patient became complete and did well pushing with coaching. At 6:44 AM a viable and healthy female was delivered via Vaginal, Spontaneous Delivery (Presentation: Right Occiput Anterior). 15 second shoulder dystocia secondary to maternal factors was relieved with McRoberts maneuver and suprapubic pressure. APGAR: 8,9; weight pending.  Infant was placed on maternal abdomen with spontaneous cry. I clamped cord x2 and cut. Cord blood was obtained. Placenta was delivered with gentle cord traction and suprapubic pressure. Placenta status: Intact, Spontaneous.  Cord: 3 vessels with the following complications: none. 2nd degree perineal laceration was repaired under local anesthesia and hemostasis was achieved prior to leaving the room.  Anesthesia: Local  Episiotomy: None Lacerations: 2nd degree Suture Repair: 3.0 vicryl Est. Blood Loss (mL): 350  Mom to postpartum.  Baby to Couplet care / Skin to Skin.  Pior, [name] 11/11/2013, 7:15 AM   I was present for and supervised the delivery of this newborn along with Dr. Erin Fulling. I agree with above documentation.    Ahleah Simko, Redmond Baseman, MD

## 2013-03-29 ENCOUNTER — Inpatient Hospital Stay (HOSPITAL_COMMUNITY): Payer: PRIVATE HEALTH INSURANCE

## 2013-03-29 ENCOUNTER — Encounter (HOSPITAL_COMMUNITY): Payer: Self-pay | Admitting: *Deleted

## 2013-03-29 ENCOUNTER — Inpatient Hospital Stay (HOSPITAL_COMMUNITY)
Admission: AD | Admit: 2013-03-29 | Discharge: 2013-03-29 | Disposition: A | Discharge status: Home / Self Care | Payer: PRIVATE HEALTH INSURANCE | Source: Ambulatory Visit | Attending: Obstetrics & Gynecology | Admitting: Obstetrics & Gynecology

## 2013-03-29 DIAGNOSIS — IMO0002 Reserved for concepts with insufficient information to code with codable children: Secondary | ICD-10-CM | POA: Insufficient documentation

## 2013-03-29 DIAGNOSIS — R109 Unspecified abdominal pain: Secondary | ICD-10-CM | POA: Insufficient documentation

## 2013-03-29 DIAGNOSIS — Z331 Pregnant state, incidental: Secondary | ICD-10-CM

## 2013-03-29 DIAGNOSIS — Z3490 Encounter for supervision of normal pregnancy, unspecified, unspecified trimester: Secondary | ICD-10-CM

## 2013-03-29 LAB — URINALYSIS, ROUTINE W REFLEX MICROSCOPIC
Glucose, UA: NEGATIVE mg/dL
Hgb urine dipstick: NEGATIVE
Ketones, ur: NEGATIVE mg/dL
Protein, ur: NEGATIVE mg/dL

## 2013-03-29 NOTE — MAU Note (Signed)
Patient is in to confirm pregnancy and c/o pain and weakness. She denies vaginal bleeding or discharge.

## 2013-03-29 NOTE — MAU Provider Note (Signed)
First Provider Initiated Contact with Patient 03/29/13 1612      Chief Complaint:  Abdominal Pain and Fatigue   Christine Taylor is  27 y.o. G3P0010 at [redacted]w[redacted]d presents complaining of Abdominal Pain and Fatigue She is sure of her LMP. Obstetrical/Gynecological History: Menstrual History: OB History   Grav Para Term Preterm Abortions TAB SAB Ect Mult Living   3    1  1    0       Patient's last menstrual period was 02/06/2013.     Past Medical History: Past Medical History  Diagnosis Date  . Asthma     as chlid  . Anemia   . Urinary tract infection     Past Surgical History: Past Surgical History  Procedure Laterality Date  . No past surgeries      Family History: Family History  Problem Relation Age of Onset  . Anesthesia problems Neg Hx   . Hypertension Father     Social History: History  Substance Use Topics  . Smoking status: Never Smoker   . Smokeless tobacco: Never Used  . Alcohol Use: No    Allergies: No Known Allergies  Meds:  Prescriptions prior to admission  Medication Sig Dispense Refill  . Multiple Vitamins-Minerals (MULTIVITAMIN WITH MINERALS) tablet Take 1 tablet by mouth daily.        Review of Systems   Constitutional: Negative for fever and chills Eyes: Negative for visual disturbances Respiratory: Negative for shortness of breath, dyspnea Cardiovascular: Negative for chest pain or palpitations  Gastrointestinal: Negative for vomiting, diarrhea and constipation.  Positive for abdominal pain, Left mid abdominal area.  "mild" per pt, usually when "I'm [place]" Genitourinary: Negative for dysuria and urgency Musculoskeletal: Negative for back pain, joint pain, myalgias  Neurological: Negative for dizziness and headaches    Physical Exam  Blood pressure 98/60, pulse 79, temperature 98.3 F (36.8 C), temperature source Oral, resp. rate 16, height 5\' 3"  (1.6 m), weight 60.328 kg (133 lb), last menstrual period 02/06/2013, SpO2  100.00%. GENERAL: Well-developed, well-nourished female in no acute distress.  LUNGS: Clear to auscultation bilaterally.  HEART: Regular rate and rhythm. ABDOMEN: Soft, nontender, nondistended, gravid.  EXTREMITIES: Nontender, no edema, 2+ distal pulses.   Labs: Results for orders placed during the hospital encounter of 03/29/13 (from the past 24 hour(s))  URINALYSIS, ROUTINE W REFLEX MICROSCOPIC   Collection Time    03/29/13  3:40 PM      Result Value Range   Color, Urine YELLOW  YELLOW   APPearance CLEAR  CLEAR   Specific Gravity, Urine 1.010  1.005 - 1.030   pH 7.0  5.0 - 8.0   Glucose, UA NEGATIVE  NEGATIVE mg/dL   Hgb urine dipstick NEGATIVE  NEGATIVE   Bilirubin Urine NEGATIVE  NEGATIVE   Ketones, ur NEGATIVE  NEGATIVE mg/dL   Protein, ur NEGATIVE  NEGATIVE mg/dL   Urobilinogen, UA 1.0  0.0 - 1.0 mg/dL   Nitrite NEGATIVE  NEGATIVE   Leukocytes, UA NEGATIVE  NEGATIVE  POCT PREGNANCY, URINE   Collection Time    03/29/13  4:06 PM      Result Value Range   Preg Test, Ur POSITIVE (*) NEGATIVE  HCG, QUANTITATIVE, PREGNANCY   Collection Time    03/29/13  4:18 PM      Result Value Range   hCG, Beta Chain, Quant, S 46591 (*) <5 mIU/mL   Imaging Studies:  RADIOLOGY REPORT*   Clinical Data: 45000 HCG,  left abd pain/ r/o ectopic,  OBSTETRIC <14 WK [place]   Technique: Transabdominal ultrasound examination was performed for complete evaluation of the gestation as well as the maternal uterus, adnexal regions, and pelvic cul-de-sac.   Comparison: None.   Findings: There is a single intrauterine gestation.  Based on crown- rump length, estimated gestational age is 8 weeks 2 days.  Fetal heart rate 170 beats per minute.  Yolk sac is visualized.  No subchorionic hemorrhage.   There is a posterior uterine fibroid which measures up to 3.2 cm.   Ovaries are symmetric in size and echotexture.  No adnexal masses. No free fluid.   IMPRESSION: 8-week-2-day intrauterine  pregnancy.  Fetal heart rate 170 beats per minute.     Original Report Authenticated By: Charlett Nose, M.D.  Assessment: Christine Taylor is  27 y.o. G3P0010 at [redacted]w[redacted]d presents with viable IUP.  Plan: Pt informed.  She is advised to seek [facility]  CRESENZO-DISHMAN,Ayaka Andes 5/12/20145:36 PM

## 2013-03-31 NOTE — MAU Provider Note (Signed)
Attestation of Attending Supervision of Advanced Practitioner (PA/CNM/NP): Evaluation and management procedures were performed by the Advanced Practitioner under my supervision and collaboration.  I have reviewed the Advanced Practitioner's note and chart, and I agree with the management and plan.  Aman Batley, MD, FACOG Attending Obstetrician & Gynecologist Faculty Practice, [facility]  

## 2013-07-05 ENCOUNTER — Other Ambulatory Visit (HOSPITAL_COMMUNITY): Payer: Self-pay | Admitting: Physician Assistant

## 2013-07-05 DIAGNOSIS — Z0489 Encounter for examination and observation for other specified reasons: Secondary | ICD-10-CM

## 2013-07-05 LAB — OB RESULTS CONSOLE HIV ANTIBODY (ROUTINE TESTING): HIV: NONREACTIVE

## 2013-07-05 LAB — OB RESULTS CONSOLE GC/CHLAMYDIA
Chlamydia: NEGATIVE
Gonorrhea: NEGATIVE

## 2013-07-05 LAB — OB RESULTS CONSOLE ANTIBODY SCREEN: Antibody Screen: NEGATIVE

## 2013-07-05 LAB — OB RESULTS CONSOLE RUBELLA ANTIBODY, IGM: Rubella: IMMUNE

## 2013-07-06 ENCOUNTER — Ambulatory Visit (HOSPITAL_COMMUNITY)
Admission: RE | Admit: 2013-07-06 | Discharge: 2013-07-06 | Disposition: A | Discharge status: Home / Self Care | Payer: Medicaid Other | Source: Ambulatory Visit | Attending: Physician Assistant | Admitting: Physician Assistant

## 2013-07-06 DIAGNOSIS — O3410 Maternal care for benign tumor of corpus uteri, unspecified trimester: Secondary | ICD-10-CM | POA: Insufficient documentation

## 2013-07-06 DIAGNOSIS — Z0489 Encounter for examination and observation for other specified reasons: Secondary | ICD-10-CM

## 2013-07-06 DIAGNOSIS — Z3689 Encounter for other specified antenatal screening: Secondary | ICD-10-CM | POA: Insufficient documentation

## 2013-08-17 ENCOUNTER — Encounter: Payer: Self-pay | Admitting: Family Medicine

## 2013-08-17 ENCOUNTER — Encounter: Payer: PRIVATE HEALTH INSURANCE | Admitting: Family Medicine

## 2013-08-18 ENCOUNTER — Encounter: Payer: Self-pay | Admitting: *Deleted

## 2013-08-23 IMAGING — US US OB TRANSVAGINAL
1 series · 14 of 28 positions shown · non-contrast
Comparison: 03/27/2012

CLINICAL DATA: Bleeding.  Cramping.

TRANSVAGINAL OB ULTRASOUND
TECHNIQUE: Transvaginal ultrasound was performed for evaluation of
the gestation as well as the maternal uterus and adnexal regions.

[Series 1: us ob transvaginal · 14 of 30 slices shown]
[im 2/30]
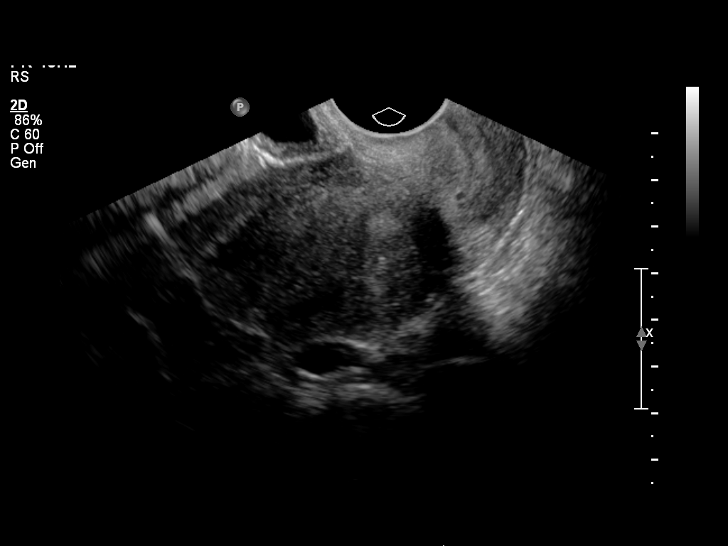
[im 4/30]
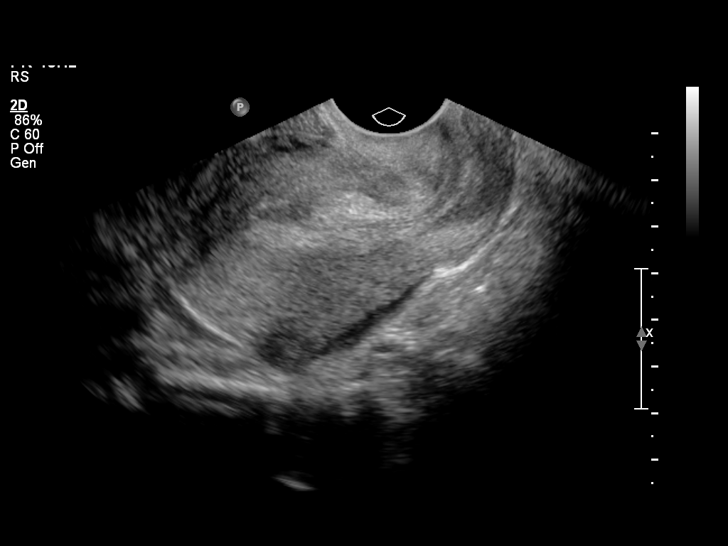
[im 6/30]
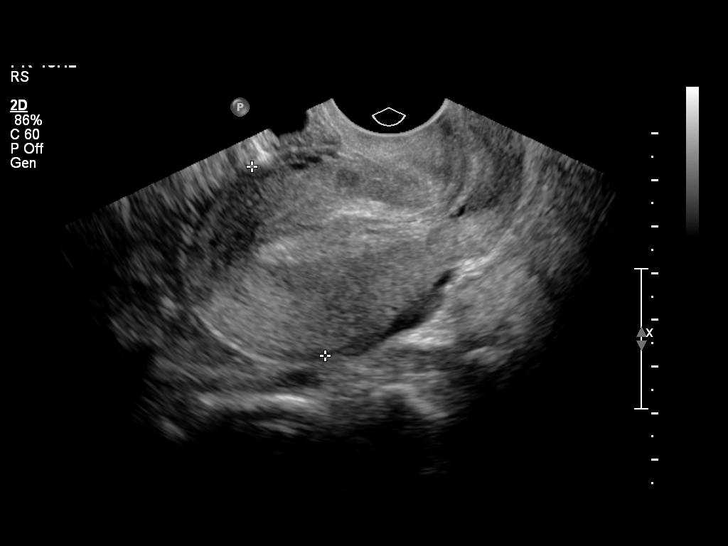
[im 8/30]
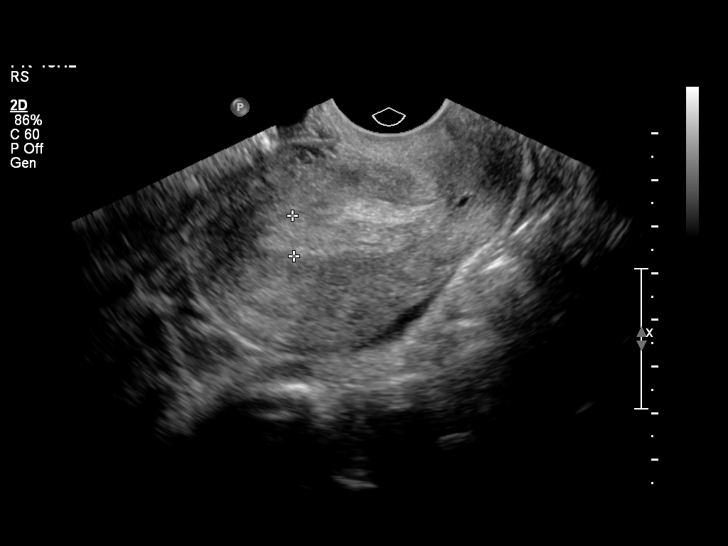
[im 10/30]
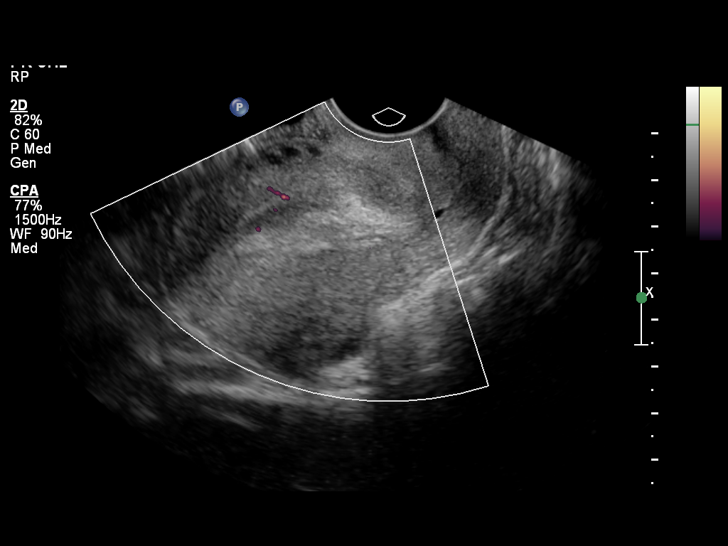
[im 12/30]
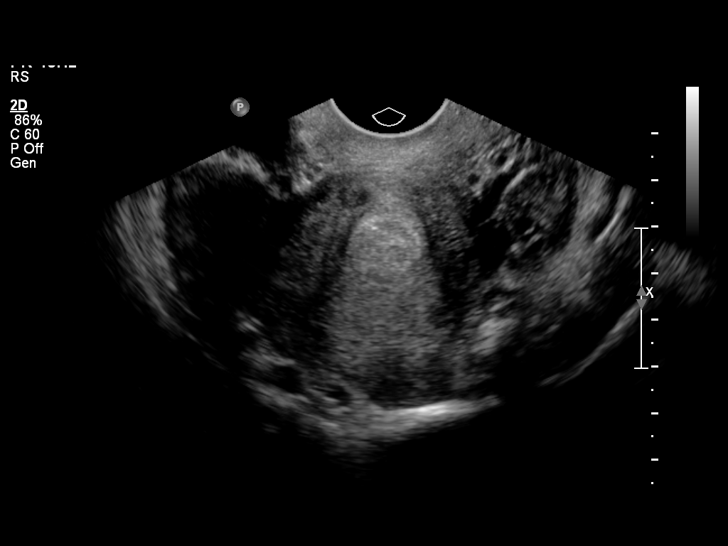
[im 14/30]
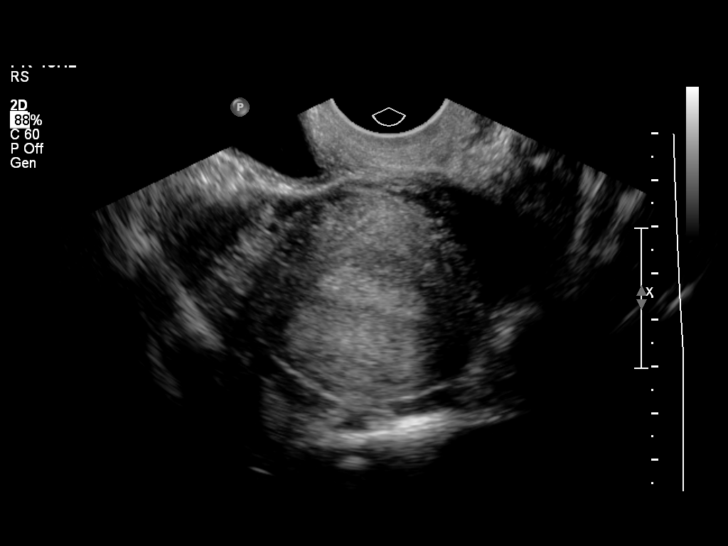
[im 17/30]
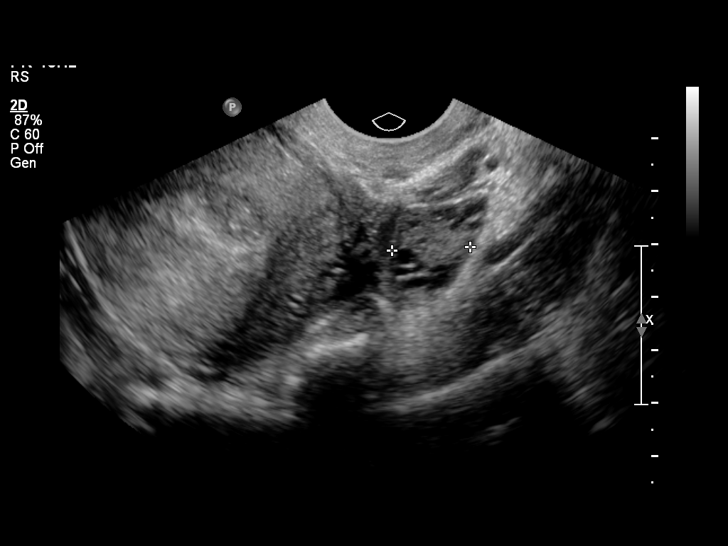
[im 19/30]
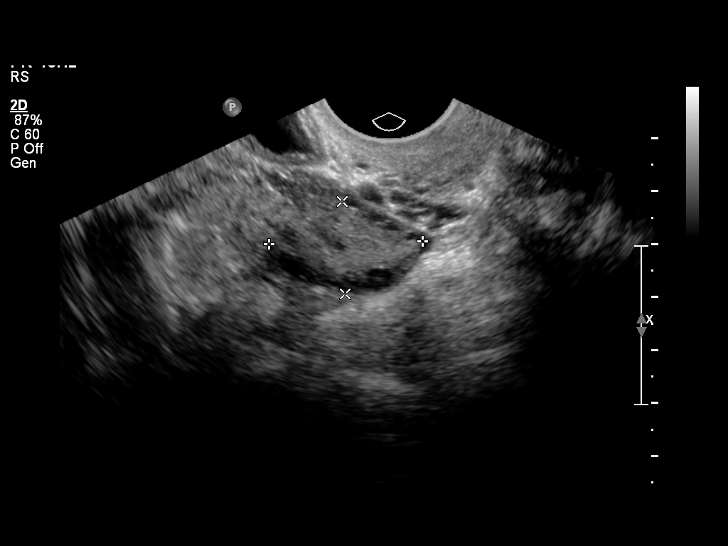
[im 21/30]
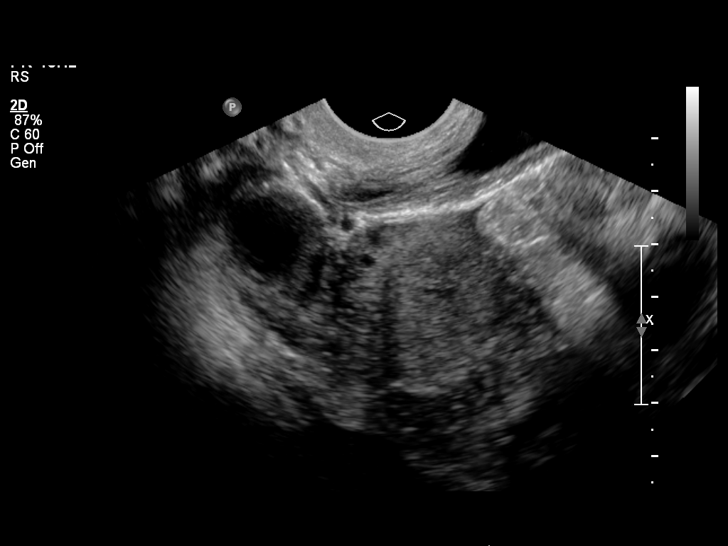
[im 23/30]
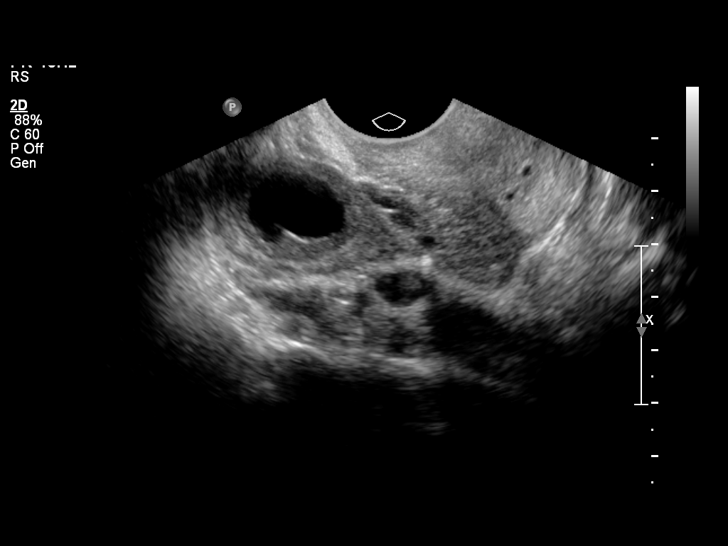
[im 25/30]
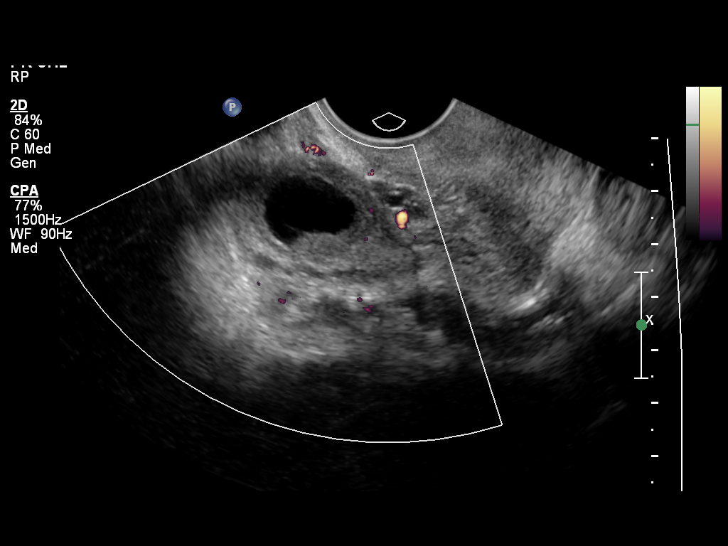
[im 27/30]
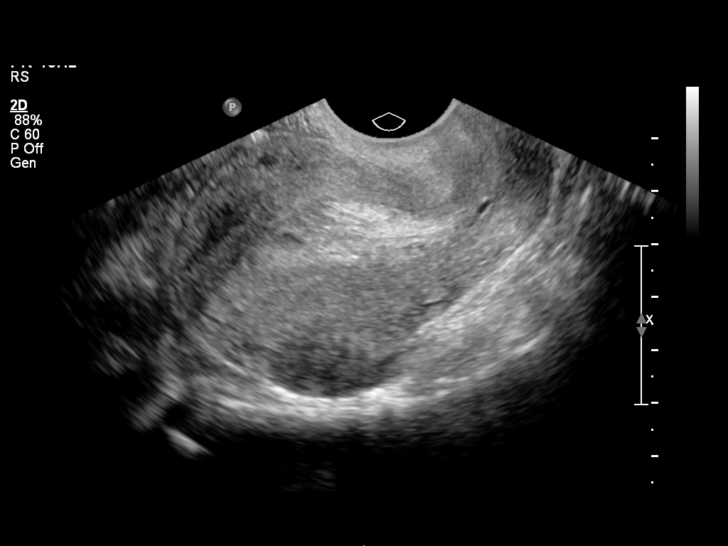
[im 30/30]
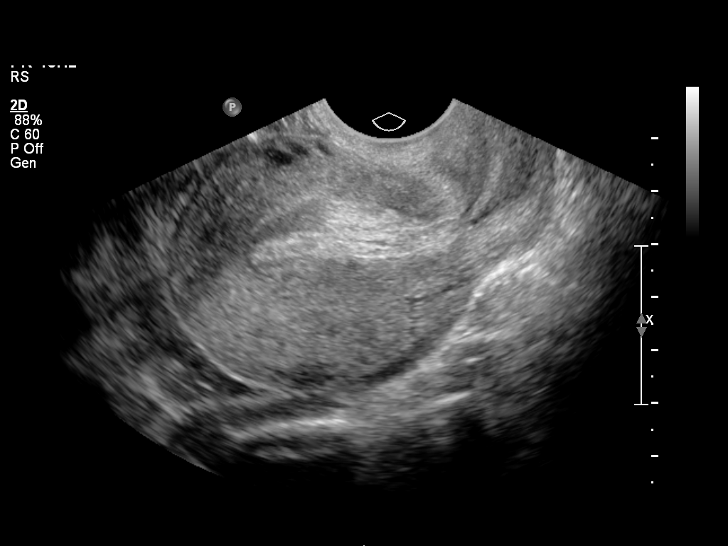

[14 of 28 positions shown; findings below may reference images not displayed]

FINDINGS: Previously seen intrauterine gestational sac is no longer
present.  Endometrium measures up to 1.4 cm in thickness, with
heterogeneity most compatible with blood products.

A trace amount of free pelvic fluid is present posterior to the
uterus.

The left ovary measures 1.5 x 2.9 x 1.8 cm and the right ovary
measures 2.2 x 3.7 x 2.2 cm and contains a corpus luteum cyst.

A low echogenicity structure along the posterior margin of the
uterus is observed, and may represent a region of vascularity or
conceivably a small posterior fibroid.
IMPRESSION: 1.  Previously seen intrauterine gestational sac is no longer
present, and there are blood products in the endometrial canal,
compatible with spontaneous abortion.
2.  Low echogenicity structure along the posterior margin of the
uterus is indistinctly seen, and may represent a region of
vascularity or conceivably a small fibroid.
3.  Trace free pelvic fluid.

## 2013-09-06 ENCOUNTER — Encounter: Payer: PRIVATE HEALTH INSURANCE | Admitting: Family Medicine

## 2013-10-01 ENCOUNTER — Encounter: Payer: Self-pay | Admitting: *Deleted

## 2013-10-14 LAB — OB RESULTS CONSOLE GBS: GBS: NEGATIVE

## 2013-11-08 ENCOUNTER — Other Ambulatory Visit (HOSPITAL_COMMUNITY): Payer: Self-pay | Admitting: Physician Assistant

## 2013-11-08 DIAGNOSIS — O480 Post-term pregnancy: Secondary | ICD-10-CM

## 2013-11-09 ENCOUNTER — Telehealth (HOSPITAL_COMMUNITY): Payer: Self-pay | Admitting: *Deleted

## 2013-11-09 ENCOUNTER — Encounter (HOSPITAL_COMMUNITY): Payer: Self-pay | Admitting: *Deleted

## 2013-11-09 NOTE — Telephone Encounter (Signed)
Preadmission screen  

## 2013-11-10 ENCOUNTER — Inpatient Hospital Stay (HOSPITAL_COMMUNITY)
Admission: AD | Admit: 2013-11-10 | Discharge: 2013-11-13 | DRG: 775 | Disposition: A | Discharge status: Home / Self Care | Payer: Medicaid Other | Source: Ambulatory Visit | Attending: Obstetrics & Gynecology | Admitting: Obstetrics & Gynecology

## 2013-11-10 ENCOUNTER — Encounter (HOSPITAL_COMMUNITY): Payer: Self-pay

## 2013-11-10 DIAGNOSIS — D4959 Neoplasm of unspecified behavior of other genitourinary organ: Secondary | ICD-10-CM | POA: Diagnosis present

## 2013-11-10 DIAGNOSIS — O9902 Anemia complicating childbirth: Secondary | ICD-10-CM | POA: Diagnosis present

## 2013-11-10 DIAGNOSIS — D649 Anemia, unspecified: Secondary | ICD-10-CM | POA: Diagnosis present

## 2013-11-10 DIAGNOSIS — O34599 Maternal care for other abnormalities of gravid uterus, unspecified trimester: Secondary | ICD-10-CM | POA: Diagnosis present

## 2013-11-10 DIAGNOSIS — D259 Leiomyoma of uterus, unspecified: Secondary | ICD-10-CM | POA: Diagnosis present

## 2013-11-10 DIAGNOSIS — O368390 Maternal care for abnormalities of the fetal heart rate or rhythm, unspecified trimester, not applicable or unspecified: Secondary | ICD-10-CM | POA: Diagnosis present

## 2013-11-10 NOTE — MAU Note (Signed)
Contractions every few min, 2 cm in office on Monday. Blood tinge mucus pt reports.

## 2013-11-11 ENCOUNTER — Encounter (HOSPITAL_COMMUNITY): Payer: Self-pay | Admitting: General Practice

## 2013-11-11 DIAGNOSIS — O368390 Maternal care for abnormalities of the fetal heart rate or rhythm, unspecified trimester, not applicable or unspecified: Secondary | ICD-10-CM | POA: Diagnosis present

## 2013-11-11 DIAGNOSIS — O3410 Maternal care for benign tumor of corpus uteri, unspecified trimester: Secondary | ICD-10-CM

## 2013-11-11 DIAGNOSIS — D259 Leiomyoma of uterus, unspecified: Secondary | ICD-10-CM

## 2013-11-11 LAB — CBC
HCT: 29.1 % — ABNORMAL LOW (ref 36.0–46.0)
MCH: 30.6 pg (ref 26.0–34.0)
MCV: 87.4 fL (ref 78.0–100.0)
RBC: 3.33 MIL/uL — ABNORMAL LOW (ref 3.87–5.11)
RDW: 13.3 % (ref 11.5–15.5)
WBC: 9.3 10*3/uL (ref 4.0–10.5)

## 2013-11-11 LAB — TYPE AND SCREEN
ABO/RH(D): O POS
Antibody Screen: NEGATIVE

## 2013-11-11 MED ORDER — IBUPROFEN 600 MG PO TABS
600.0000 mg | ORAL_TABLET | Freq: Four times a day (QID) | ORAL | Status: DC | PRN
  Administered 2013-11-11: 600 mg via ORAL
  Filled 2013-11-11: qty 1

## 2013-11-11 MED ORDER — FLEET ENEMA 7-19 GM/118ML RE ENEM: 1.0000 | ENEMA | RECTAL | Status: DC | PRN

## 2013-11-11 MED ORDER — SENNOSIDES-DOCUSATE SODIUM 8.6-50 MG PO TABS
2.0000 | ORAL_TABLET | ORAL | Status: DC
  Administered 2013-11-11 – 2013-11-13 (×2): 2 via ORAL
  Filled 2013-11-11 (×2): qty 2

## 2013-11-11 MED ORDER — LANOLIN HYDROUS EX OINT: TOPICAL_OINTMENT | CUTANEOUS | Status: DC | PRN

## 2013-11-11 MED ORDER — OXYCODONE-ACETAMINOPHEN 5-325 MG PO TABS
1.0000 | ORAL_TABLET | ORAL | Status: DC | PRN
  Administered 2013-11-12: 1 via ORAL
  Filled 2013-11-11: qty 1

## 2013-11-11 MED ORDER — SIMETHICONE 80 MG PO CHEW: 80.0000 mg | CHEWABLE_TABLET | ORAL | Status: DC | PRN

## 2013-11-11 MED ORDER — TERBUTALINE SULFATE 1 MG/ML IJ SOLN: 0.2500 mg | Freq: Once | INTRAMUSCULAR | Status: DC | PRN

## 2013-11-11 MED ORDER — ONDANSETRON HCL 4 MG PO TABS: 4.0000 mg | ORAL_TABLET | ORAL | Status: DC | PRN

## 2013-11-11 MED ORDER — OXYCODONE-ACETAMINOPHEN 5-325 MG PO TABS: 1.0000 | ORAL_TABLET | ORAL | Status: DC | PRN

## 2013-11-11 MED ORDER — PRENATAL MULTIVITAMIN CH
1.0000 | ORAL_TABLET | Freq: Every day | ORAL | Status: DC
  Administered 2013-11-12 – 2013-11-13 (×2): 1 via ORAL
  Filled 2013-11-11 (×3): qty 1

## 2013-11-11 MED ORDER — BUTORPHANOL TARTRATE 1 MG/ML IJ SOLN
2.0000 mg | INTRAMUSCULAR | Status: DC | PRN
  Administered 2013-11-11: 2 mg via INTRAVENOUS
  Filled 2013-11-11: qty 2

## 2013-11-11 MED ORDER — CITRIC ACID-SODIUM CITRATE 334-500 MG/5ML PO SOLN: 30.0000 mL | ORAL | Status: DC | PRN

## 2013-11-11 MED ORDER — FENTANYL CITRATE 0.05 MG/ML IJ SOLN
100.0000 ug | INTRAMUSCULAR | Status: DC | PRN
  Administered 2013-11-11: 100 ug via INTRAVENOUS
  Filled 2013-11-11: qty 2

## 2013-11-11 MED ORDER — LIDOCAINE HCL (PF) 1 % IJ SOLN
30.0000 mL | INTRAMUSCULAR | Status: AC | PRN
  Administered 2013-11-11: 30 mL via SUBCUTANEOUS
  Filled 2013-11-11 (×2): qty 30

## 2013-11-11 MED ORDER — TETANUS-DIPHTH-ACELL PERTUSSIS 5-2.5-18.5 LF-MCG/0.5 IM SUSP: 0.5000 mL | Freq: Once | INTRAMUSCULAR | Status: DC

## 2013-11-11 MED ORDER — ACETAMINOPHEN 325 MG PO TABS: 650.0000 mg | ORAL_TABLET | ORAL | Status: DC | PRN

## 2013-11-11 MED ORDER — DIBUCAINE 1 % RE OINT
1.0000 "application " | TOPICAL_OINTMENT | RECTAL | Status: DC | PRN
  Filled 2013-11-11: qty 28

## 2013-11-11 MED ORDER — ONDANSETRON HCL 4 MG/2ML IJ SOLN: 4.0000 mg | Freq: Four times a day (QID) | INTRAMUSCULAR | Status: DC | PRN

## 2013-11-11 MED ORDER — ZOLPIDEM TARTRATE 5 MG PO TABS: 5.0000 mg | ORAL_TABLET | Freq: Every evening | ORAL | Status: DC | PRN

## 2013-11-11 MED ORDER — WITCH HAZEL-GLYCERIN EX PADS: 1.0000 "application " | MEDICATED_PAD | CUTANEOUS | Status: DC | PRN

## 2013-11-11 MED ORDER — IBUPROFEN 600 MG PO TABS
600.0000 mg | ORAL_TABLET | Freq: Four times a day (QID) | ORAL | Status: DC
  Administered 2013-11-11 – 2013-11-13 (×8): 600 mg via ORAL
  Filled 2013-11-11 (×9): qty 1

## 2013-11-11 MED ORDER — LACTATED RINGERS IV SOLN
INTRAVENOUS | Status: DC
  Administered 2013-11-11: 02:00:00 via INTRAVENOUS

## 2013-11-11 MED ORDER — LACTATED RINGERS IV SOLN
500.0000 mL | INTRAVENOUS | Status: DC | PRN
  Administered 2013-11-11: 750 mL via INTRAVENOUS

## 2013-11-11 MED ORDER — DIPHENHYDRAMINE HCL 25 MG PO CAPS: 25.0000 mg | ORAL_CAPSULE | Freq: Four times a day (QID) | ORAL | Status: DC | PRN

## 2013-11-11 MED ORDER — OXYTOCIN 40 UNITS IN LACTATED RINGERS INFUSION - SIMPLE MED: 62.5000 mL/h | INTRAVENOUS | Status: DC

## 2013-11-11 MED ORDER — OXYTOCIN 10 UNIT/ML IJ SOLN
INTRAMUSCULAR | Status: AC
  Administered 2013-11-11: 20 [IU]
  Filled 2013-11-11: qty 2

## 2013-11-11 MED ORDER — OXYTOCIN BOLUS FROM INFUSION: 500.0000 mL | INTRAVENOUS | Status: DC

## 2013-11-11 MED ORDER — BENZOCAINE-MENTHOL 20-0.5 % EX AERO
1.0000 "application " | INHALATION_SPRAY | CUTANEOUS | Status: DC | PRN
  Administered 2013-11-11: 1 via TOPICAL
  Filled 2013-11-11 (×2): qty 56

## 2013-11-11 MED ORDER — OXYTOCIN 40 UNITS IN LACTATED RINGERS INFUSION - SIMPLE MED
1.0000 m[IU]/min | INTRAVENOUS | Status: DC
  Administered 2013-11-11: 2 m[IU]/min via INTRAVENOUS
  Administered 2013-11-11: 4 m[IU]/min via INTRAVENOUS
  Filled 2013-11-11: qty 1000

## 2013-11-11 MED ORDER — ONDANSETRON HCL 4 MG/2ML IJ SOLN: 4.0000 mg | INTRAMUSCULAR | Status: DC | PRN

## 2013-11-11 NOTE — H&P (Signed)
Christine Taylor is a 27 y.o. female G2P0010 with IUP at [redacted]w[redacted]d by [place] presenting for contractions. Pt states she has been having regular, every 6-8 minutes contractions, associated with spotting vaginal bleeding.  Membranes are intact, with active fetal movement. PNCare at [facility] since 22 wks Denies fever/chills, headache, vision change, shortness of breath, chest pain, RUQ/epigastric pain, nausea/vomiting, dysuria, swelling/tenderness in legs.  Prenatal History/Complications: late to care, fibroid, anemia  Past Medical History: Past Medical History  Diagnosis Date  . Asthma     as chlid  . Anemia   . Urinary tract infection   . Hx of varicella   . Fibroid   . Late prenatal care     Past Surgical History: Past Surgical History  Procedure Laterality Date  . No past surgeries      Obstetrical History: OB History   Grav Para Term Preterm Abortions TAB SAB Ect Mult Living   2    1  1    0      Social History: History   Social History  . Marital Status: Married    Spouse Name: N/A    Number of Children: N/A  . Years of Education: N/A   Social History Main Topics  . Smoking status: Never Smoker   . Smokeless tobacco: Never Used  . Alcohol Use: No  . Drug Use: No  . Sexual Activity: Yes    Birth Control/ Protection: Condom   Other Topics Concern  . None   Social History Narrative  . None    Family History: Family History  Problem Relation Age of Onset  . Anesthesia problems Neg Hx   . Hypertension Father     Allergies: No Known Allergies  Prescriptions prior to admission  Medication Sig Dispense Refill  . Multiple Vitamins-Minerals (MULTIVITAMIN WITH MINERALS) tablet Take 1 tablet by mouth daily.         Review of Systems  All systems reviewed and negative except as stated in HPI   Physical Exam Blood pressure 128/72, pulse 91, temperature 98.5 F (36.9 C), temperature source Oral, resp. rate 18, last menstrual period 02/06/2013. General appearance:  alert, cooperative and no distress Lungs: clear to auscultation bilaterally Heart: regular rate and rhythm Abdomen: soft, non-tender; bowel sounds normal Pelvic: normal vulva Extremities: Homans sign is negative, no sign of DVT DTR's symmetric 2+ Presentation: cephalic Fetal monitoringBaseline: 135 bpm, Variability: Good {> 6 bpm), Accelerations: 10x10 and Decelerations: Absent At presentation: variables and early decels present with wandering baseline with less variability, improved with starting IVF and position change Uterine activity: Frequency: Every 6-8 minutes, Duration: 30-45 seconds and Intensity: mild Dilation: 1.5 Effacement (%): 90 Station: -1 Exam by:: D Callaway RN   Prenatal labs: ABO, Rh: O/Positive/-- (08/18 0000) Antibody: Negative (08/18 0000) Rubella:  Immune RPR: Nonreactive (08/18 0000)  HBsAg: Negative (08/18 0000)  HIV: Non-reactive (08/18 0000)  GBS: Negative (11/27 0000)  1 hr Glucola 107 Genetic screening: declined Anatomy US normal  Assessment: Christine Taylor is a 27 y.o. G2P0010 with an IUP at [redacted]w[redacted]d presenting for early onset of labor and found to have nonreactive NST  Plan: Admit to L&D  #Labor: in early labor, will augment with pitocin as tolerated by fetal well-being #Pain: labor support without medications at this time, patient does not want epidural #FWB: Cat 2, continue monitoring #ID: GBS neg  Pior, [name], MD 11/11/2013, 12:34 AM  I have seen and examined this patient and agree with above documentation in the resident's note. Pt  presented in very early labor but with a nonreactive tracing and some variable decels. Given pt is [redacted]w[redacted]d, will admit and augment with pitocin for delivery.  Tracing had improved by the time pt arrived on the L&D floor.  Anticipate SVD.   Rulon Abide, M.D. [facility] Fellow 11/11/2013 4:06 AM

## 2013-11-11 NOTE — Lactation Note (Signed)
This note was copied from the chart of Christine Taylor. Lactation Consultation Note  Patient Name: Christine Taylor [id number] Date: 11/11/2013 Reason for consult: Initial assessment  Visited with Mom, baby at 16 hrs old.  Mom trying to latch baby in football, laid back position.  Mom with small breasts, with short shafted nipples, almost flat.  Talked to RN about getting Mom to wear shells when she puts her bra on.  Baby positioned in more upright position.  Showed Mom how to do manual breast expression, colostrum easily expressed.  Baby rooted and opened and latched onto areola easily.  Sucking but no swallows heard.  Repeatedly had to put baby back on deeper when he slipped.  Mom denies any pinching feeling.  Initiated a 20 mm nipple shield with instructions.  Baby gave a couple sucks and swallows heard before her fell asleep.  Colostrum in shield.  Baby wrapped an placed in crib as Mom fell asleep during the assist.  RN aware of need for shells and instruction on hand pump usage prior to use of nipple shield.  Will follow up with LC later today.  Brochure left in room.  Will instruct on IP and OP lactation services at next visit due to Mom falling asleep.  Consult Status Consult Status: Follow-up Date: 11/11/13 Follow-up type: In-patient    Judee Clara 11/11/2013, 10:45 AM

## 2013-11-11 NOTE — Progress Notes (Signed)
Quantia Anderson Malta is a 27 y.o. G2P0010 at [redacted]w[redacted]d admitted for augmentation of labor for nonreactive NST  Subjective: Pt is feeling very uncomfortable with contractions. Stadol did not seem to help pain very much. No other complaints.  Objective: BP 143/85  Pulse 89  Temp(Src) 98.2 F (36.8 C) (Oral)  Resp 18  Ht 5\' 3"  (1.6 m)  Wt 79.379 kg (175 lb)  BMI 31.01 kg/m2  LMP 02/06/2013    FHT:  FHR: 140 bpm, variability: moderate,  accelerations:  Abscent,  decelerations:  Absent UC:   regular, every 1-3 minutes SVE: Dilation: Lip/rim Effacement (%): 90 Cervical Position: Posterior Station: +2 Presentation: Vertex Exam by:: Dr.Beck  Labs: Lab Results  Component Value Date   WBC 9.3 11/11/2013   HGB 10.2* 11/11/2013   HCT 29.1* 11/11/2013   MCV 87.4 11/11/2013   PLT 274 11/11/2013    Assessment / Plan: Augmentation of labor, progressing well  Labor: Progressing on Pitocin Preeclampsia:  n/a Fetal Wellbeing:  Category I Pain Control:  Fentanyl I/D:  n/a Anticipated MOD:  NSVD  Elmina Hendel, [name] 11/11/2013, 4:02 AM

## 2013-11-12 ENCOUNTER — Ambulatory Visit (HOSPITAL_COMMUNITY): Payer: Medicaid Other

## 2013-11-12 MED ORDER — IBUPROFEN 600 MG PO TABS: 600.0000 mg | ORAL_TABLET | Freq: Four times a day (QID) | ORAL | Status: DC

## 2013-11-12 NOTE — Progress Notes (Signed)
UR chart review completed.  

## 2013-11-12 NOTE — Discharge Summary (Signed)
Patient seen and examined.  Agree with above note.  Jacob J Stinson, DO 11/12/2013 7:22 AM  

## 2013-11-12 NOTE — Lactation Note (Signed)
This note was copied from the chart of Christine Taylor. Lactation Consultation Note 1100 Mom states breast feeding is not going well; states baby does not stay awake or latched on the breast; offered to assist, mom accepts. Assisted mom to position baby on the right side, using nipple shield, baby latched for about 10 minutes with few swallows and small amount colostrum in nipple shield, and baby did not maintain rhythmic sucking for more than a few suckles at a time. Milk transfer questionable. Initiated DEP, instructed mom to pump and hand express, and to call LC when she is finished.   1300 Mom called LC to state she had finished pumping and ready to feed baby. Mom had pumped about 17 mL colostrum. Attempted again to latch baby to the breast using nipple shield, but baby still did not maintain rhythmic sucking. Inserted mom's expressed colostrum into the nipple shield using a curved tip syringe, instructing mom and family members during this procedure.  Baby then got into a rhythm and sucked 17 mL breast milk.   Written instructions provided: Attempt to directly breast feed, using nipple shield if needed. If baby does not feed well, then squirt breast milk into the nipple shield. If baby does not empty the breast, pump for 15 minutes followed by hand expression. Call if any questions.   Patient Name: Christine Audrey Thull [id number] Date: 11/12/2013 Reason for consult: Follow-up assessment   Maternal Data    Feeding Feeding Type: Breast Fed  LATCH Score/Interventions Latch: Grasps breast easily, tongue down, lips flanged, rhythmical sucking.  Audible Swallowing: Spontaneous and intermittent  Type of Nipple: Everted at rest and after stimulation Intervention(s): Double electric pump  Comfort (Breast/Nipple): Filling, red/small blisters or bruises, mild/mod discomfort  Problem noted: Filling;Cracked, bleeding, blisters, bruises Interventions (Filling): Massage;Double electric  pump  Hold (Positioning): Assistance needed to correctly position infant at breast and maintain latch. Intervention(s): Breastfeeding basics reviewed;Support Pillows;Position options;Skin to skin  LATCH Score: 8  Lactation Tools Discussed/Used Tools: Nipple Shields Nipple shield size: 20 Breast pump type: Double-Electric Breast Pump Pump Review: Setup, frequency, and cleaning;Milk Storage   Consult Status Consult Status: Follow-up Follow-up type: In-patient    Octavio Manns [facility] 11/12/2013, 2:09 PM

## 2013-11-12 NOTE — Progress Notes (Signed)
Pt requested to stay additional day to work with breast feeding. Discharge order cancelled.

## 2013-11-12 NOTE — H&P (Signed)
Attestation of Attending Supervision of Fellow: Evaluation and management procedures were performed by the Fellow under my supervision and collaboration.  I have reviewed the Fellow's note and chart, and I agree with the management and plan.    

## 2013-11-12 NOTE — Discharge Summary (Signed)
Obstetric Discharge Summary Reason for Admission: onset of labor Prenatal Procedures: none Intrapartum Procedures: spontaneous vaginal delivery Postpartum Procedures: none Complications-Operative and Postpartum: 2nd degree perineal laceration Hemoglobin  Date Value Range Status  11/11/2013 10.2* 12.0 - 15.0 g/dL Final     HCT  Date Value Range Status  11/11/2013 29.1* 36.0 - 46.0 % Final   Brief Hospital Course: Pt is 27 yo G2 now P1011 after normal vaginal delivery of healthy female. She was admitted in active labor and pushed without epidural. She had a 2nd degree midline perineal laceration that was repaired under local anesthesia. She has had no other postpartum complications. On day of discharge she reports ambulating well, tolerating PO, urinating well, passing flatus. Her pain is moderate at times but controlled with ibuprofen. Her bleeding is appropriate. She is breastfeeding without complications and desires condoms vs diaphragm for contraception.  Physical Exam:  General: alert, cooperative and no distress Lochia: appropriate Uterine Fundus: firm Incision: n/a DVT Evaluation: No evidence of DVT seen on physical exam. Negative Homan's sign. No cords or calf tenderness. No significant calf/ankle edema.  Discharge Diagnoses: Term Pregnancy-delivered  Discharge Information: Date: 11/12/2013 Activity: pelvic rest Diet: routine Medications: PNV and Ibuprofen Condition: stable Instructions: refer to practice specific booklet Discharge to: home Follow-up Information   Follow up with [facility] [place] In 5 weeks. (Call as soon as possible to make appiontment for postpartum visit in 4-6 weeks)    Contact information:   [address] [place] [address] [phone]      Newborn Data: Live born female  Birth Weight: 8 lb 6.2 oz (3805 g) APGAR: 8, 9  Home with mother.  Christine Taylor, Christine Taylor 11/12/2013, 7:00 AM

## 2013-11-13 NOTE — Lactation Note (Signed)
This note was copied from the chart of Christine Augusta [place]. Lactation Consultation Note Mom states breast feeding is going well; mom has baby latched on the left side when I enter room; baby audibly gulping. Mom comfortable, except for breasts are filling and firm. Assisted mom to relieve some breast pressure by using hand pump and massage. Breast began to soften and mom reports more comfortable after pumping. Attempted to latch baby to right side after pumping, but baby too sleepy. Offered to assist with next feeding, mom to call if needed.   Enc mom to call lactation office if she has any concerns, and to attend the BFSG. Patient Name: Christine Taylor [id number] Date: 11/13/2013 Reason for consult: Follow-up assessment   Maternal Data    Feeding Feeding Type: Breast Fed Length of feed: 10 min  LATCH Score/Interventions Latch: Grasps breast easily, tongue down, lips flanged, rhythmical sucking.  Audible Swallowing: Spontaneous and intermittent Intervention(s): Skin to skin;Hand expression  Type of Nipple: Everted at rest and after stimulation Intervention(s): No intervention needed  Comfort (Breast/Nipple): Filling, red/small blisters or bruises, mild/mod discomfort  Problem noted: Mild/Moderate discomfort Interventions (Filling): Massage;Frequent nursing;Hand pump (Ice)  Hold (Positioning): Assistance needed to correctly position infant at breast and maintain latch.  LATCH Score: 8  Lactation Tools Discussed/Used     Consult Status Consult Status: PRN    Lenard Forth 11/13/2013, 1:04 PM

## 2013-11-13 NOTE — Discharge Summary (Signed)
Obstetric Discharge Summary Reason for Admission: onset of labor Prenatal Procedures: ultrasound Intrapartum Procedures: spontaneous vaginal delivery and 15sec shoulder dystocia Postpartum Procedures: none Complications-Operative and Postpartum: 2nd degree perineal laceration Eating, drinking, voiding, ambulating well.  +flatus.  Lochia and pain wnl.  Denies dizziness, lightheadedness, or sob. No complaints.  Hemoglobin  Date Value Range Status  11/11/2013 10.2* 12.0 - 15.0 g/dL Final     HCT  Date Value Range Status  11/11/2013 29.1* 36.0 - 46.0 % Final    Physical Exam:  General: alert, cooperative and no distress Lochia: appropriate Uterine Fundus: firm Incision: n/a DVT Evaluation: No evidence of DVT seen on physical exam. Negative Homan's sign. No cords or calf tenderness. No significant calf/ankle edema.  Discharge Diagnoses: Term Pregnancy-delivered  Discharge Information: Date: 11/13/2013 Activity: pelvic rest Diet: routine Medications: PNV and Ibuprofen Condition: stable Instructions: refer to practice specific booklet Discharge to: home Follow-up Information   Follow up with [facility] [place] Today. (Call as soon as possible to make appointment for postpartum visit in 4-6 weeks)    Contact information:   [address] [place] [address] [phone]      Newborn Data: Live born female  Birth Weight: 8 lb 6.2 oz (3805 g) APGAR: 8, 9  Home with mother. Breastfeeding, undecided about contraception- wants to discuss further w/ providers at [facility]. Understands to remain abstinent until contraception initiated.  Desires outpatient circumcision- information given  Marge Duncans 11/13/2013, 8:57 AM

## 2013-11-13 NOTE — Discharge Summary (Signed)
Attestation of Attending Supervision of Advanced Practitioner (CNM/NP): Evaluation and management procedures were performed by the Advanced Practitioner under my supervision and collaboration. I have reviewed the Advanced Practitioner's note and chart, and I agree with the management and plan.  LEGGETT,KELLY H. 9:10 AM   

## 2013-11-17 ENCOUNTER — Inpatient Hospital Stay (HOSPITAL_COMMUNITY): Admission: RE | Admit: 2013-11-17 | Payer: PRIVATE HEALTH INSURANCE | Source: Ambulatory Visit

## 2014-08-22 IMAGING — US US OB COMP LESS 14 WK
1 series · 14 of 19 positions shown · non-contrast
Comparison: None.

CLINICAL DATA: 28555 HCG,  left abd pain/ r/o ectopic,

OBSTETRIC <14 WK US
TECHNIQUE: Transabdominal ultrasound examination was performed for
complete evaluation of the gestation as well as the maternal
uterus, adnexal regions, and pelvic cul-de-sac.

[Series 1: us ob comp less 14 wks · 19 acquisitions, 14 frames shown]
[im 1/19]
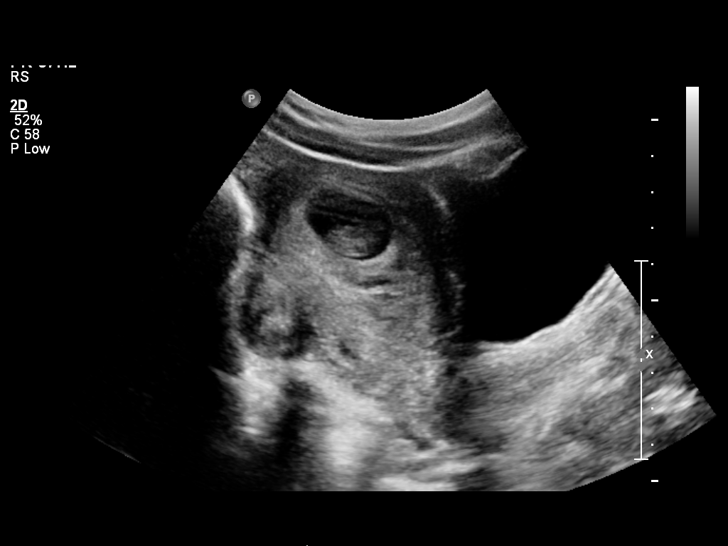
[im 3/19]
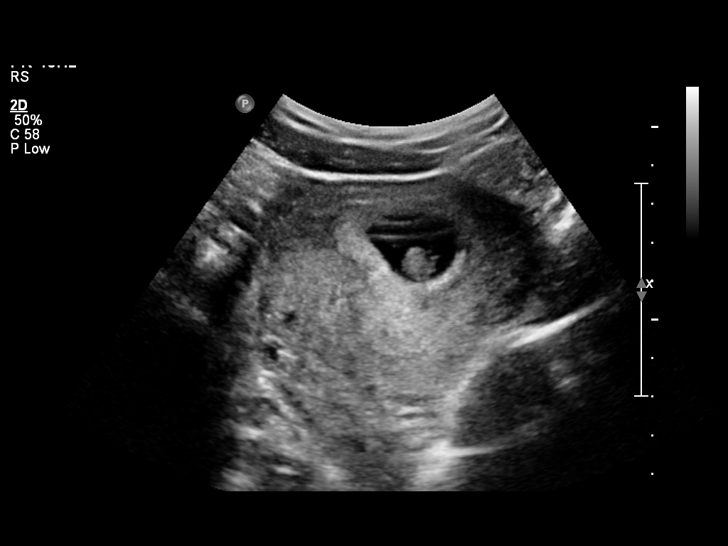
[im 4/19]
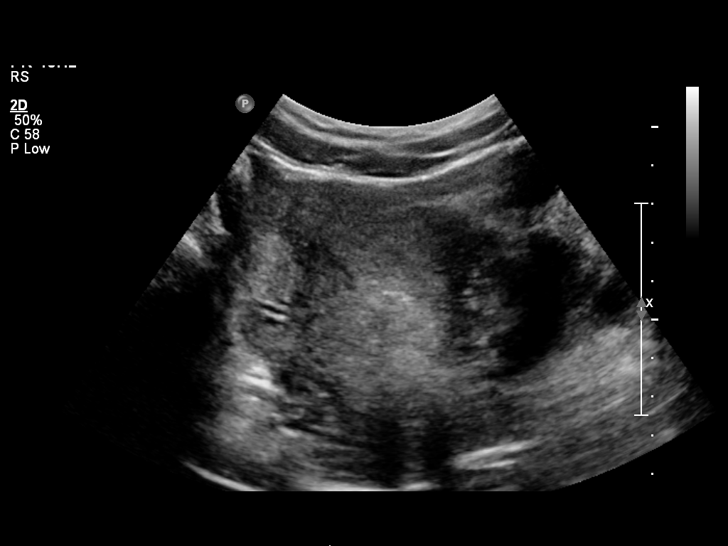
[im 5/19]
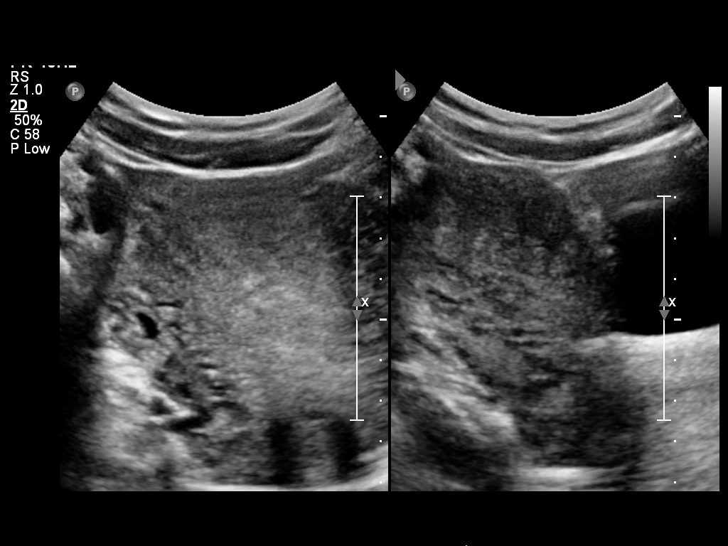
[im 7/19]
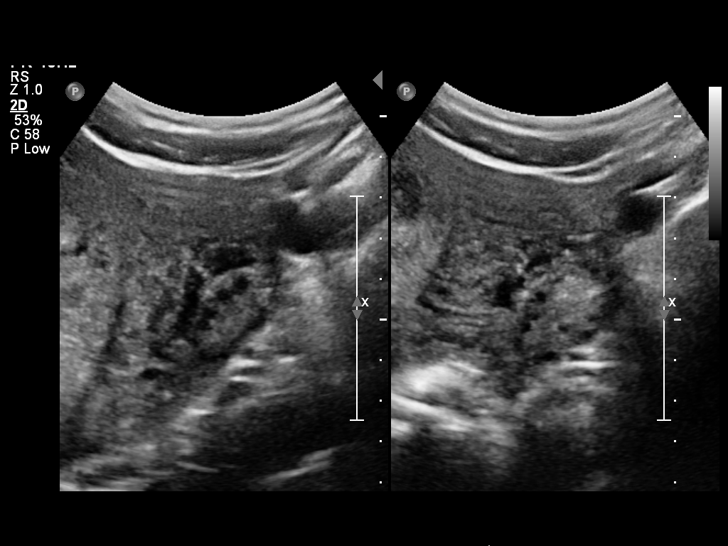
[im 8/19]
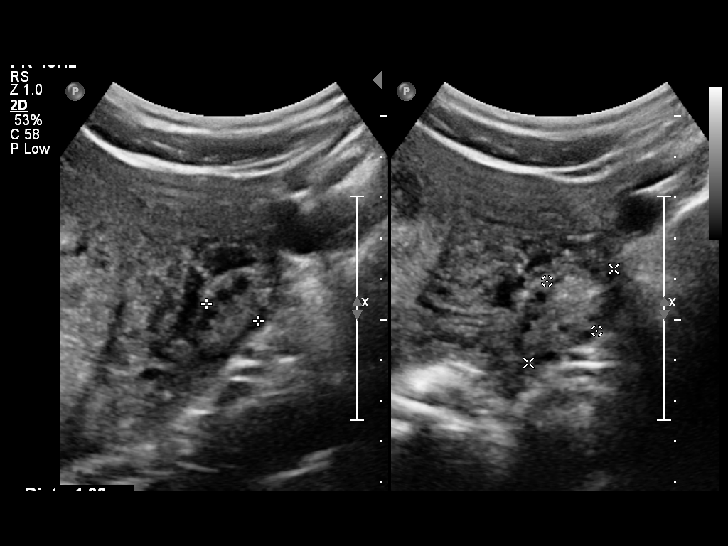
[im 9/19]
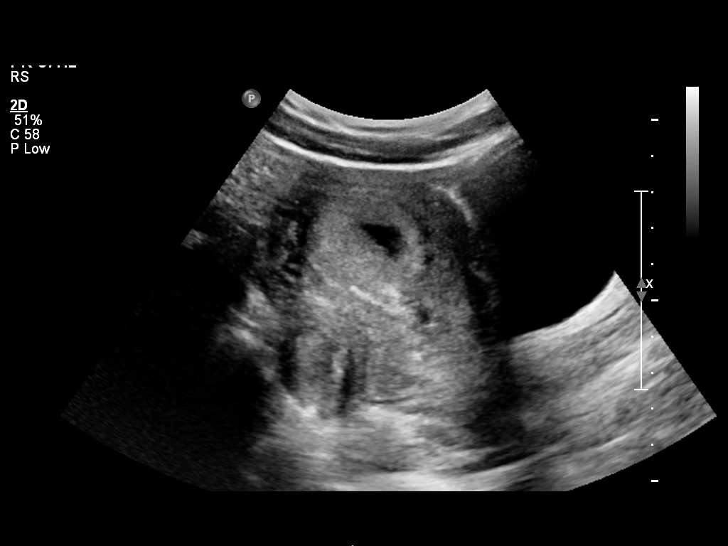
[im 11/19]
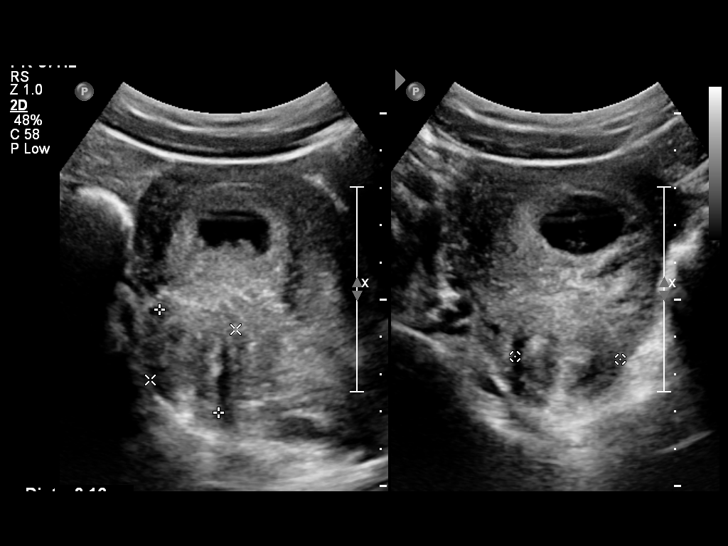
[im 12/19]
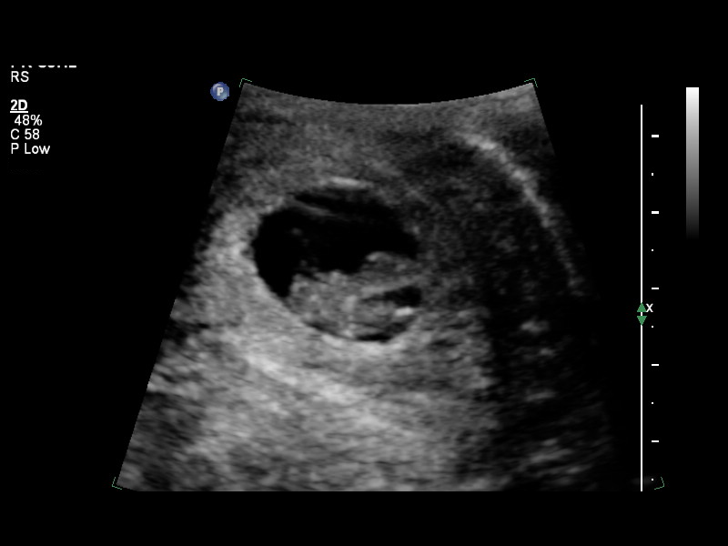
[im 13/19]
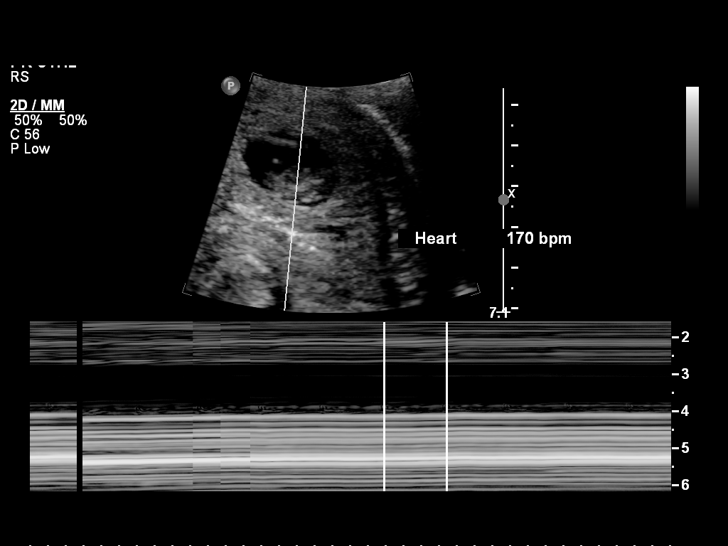
[im 15/19]
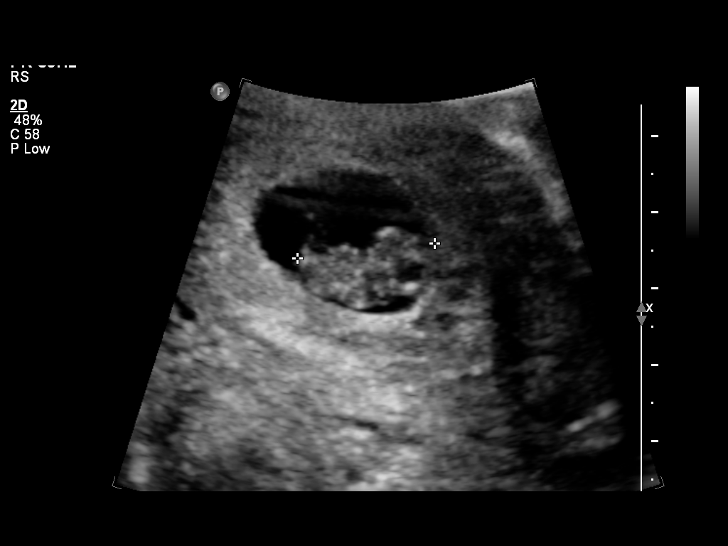
[im 16/19]
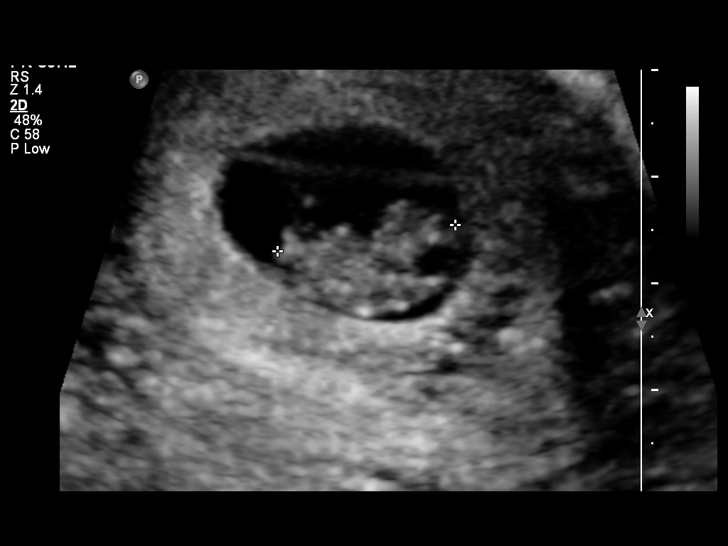
[im 17/19]
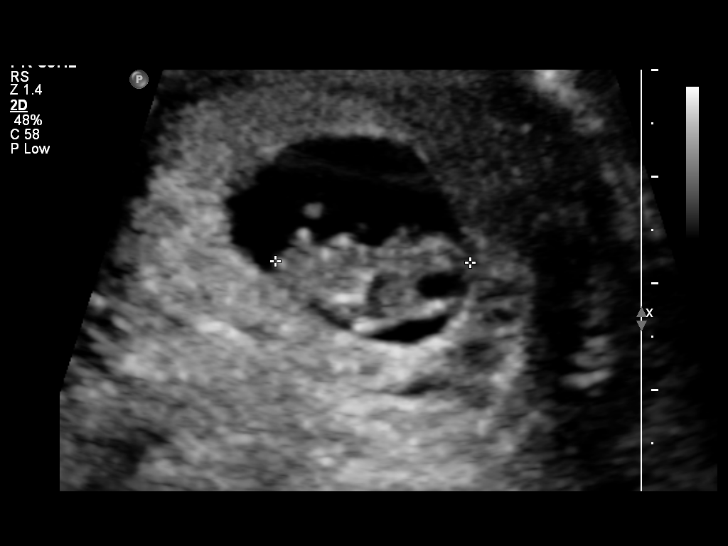
[im 19/19]
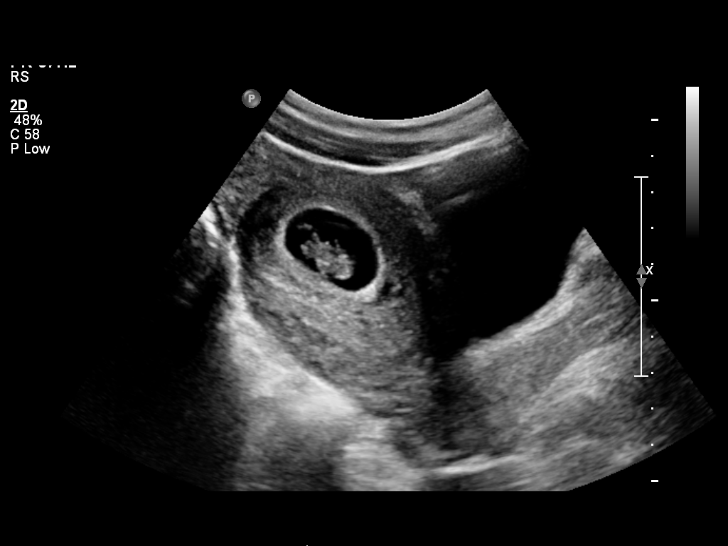

[14 of 19 positions shown; findings below may reference images not displayed]

FINDINGS: There is a single intrauterine gestation.  Based on crown-
rump length, estimated gestational age is 8 weeks 2 days.  Fetal
heart rate 170 beats per minute.  Yolk sac is visualized.  No
subchorionic hemorrhage.

There is a posterior uterine fibroid which measures up to 3.2 cm.

Ovaries are symmetric in size and echotexture.  No adnexal masses.
No free fluid.
IMPRESSION: 8-week-1-day intrauterine pregnancy.  Fetal heart rate 170 beats
per minute.

## 2014-08-25 ENCOUNTER — Encounter (HOSPITAL_COMMUNITY): Payer: Self-pay | Admitting: *Deleted

## 2014-08-25 ENCOUNTER — Inpatient Hospital Stay (HOSPITAL_COMMUNITY)
Admission: AD | Admit: 2014-08-25 | Discharge: 2014-08-25 | Disposition: A | Discharge status: Home / Self Care | Payer: Medicaid Other | Source: Ambulatory Visit | Attending: Obstetrics & Gynecology | Admitting: Obstetrics & Gynecology

## 2014-08-25 ENCOUNTER — Inpatient Hospital Stay (HOSPITAL_COMMUNITY): Payer: Medicaid Other

## 2014-08-25 DIAGNOSIS — O200 Threatened abortion: Secondary | ICD-10-CM

## 2014-08-25 DIAGNOSIS — Z3A01 Less than 8 weeks gestation of pregnancy: Secondary | ICD-10-CM | POA: Insufficient documentation

## 2014-08-25 DIAGNOSIS — O4690 Antepartum hemorrhage, unspecified, unspecified trimester: Secondary | ICD-10-CM

## 2014-08-25 DIAGNOSIS — O209 Hemorrhage in early pregnancy, unspecified: Secondary | ICD-10-CM

## 2014-08-25 LAB — CBC
HCT: 31.2 % — ABNORMAL LOW (ref 36.0–46.0)
Hemoglobin: 11.3 g/dL — ABNORMAL LOW (ref 12.0–15.0)
MCH: 30.5 pg (ref 26.0–34.0)
MCHC: 36.2 g/dL — ABNORMAL HIGH (ref 30.0–36.0)
MCV: 84.1 fL (ref 78.0–100.0)
Platelets: 236 10*3/uL (ref 150–400)
RBC: 3.71 MIL/uL — ABNORMAL LOW (ref 3.87–5.11)
RDW: 13 % (ref 11.5–15.5)
WBC: 5.6 10*3/uL (ref 4.0–10.5)

## 2014-08-25 LAB — HIV ANTIBODY (ROUTINE TESTING W REFLEX): HIV 1&2 Ab, 4th Generation: NONREACTIVE

## 2014-08-25 LAB — URINALYSIS, ROUTINE W REFLEX MICROSCOPIC
Bilirubin Urine: NEGATIVE
Glucose, UA: NEGATIVE mg/dL
Hgb urine dipstick: NEGATIVE
Ketones, ur: NEGATIVE mg/dL
Leukocytes, UA: NEGATIVE
NITRITE: NEGATIVE
Protein, ur: NEGATIVE mg/dL
SPECIFIC GRAVITY, URINE: 1.015 (ref 1.005–1.030)
Urobilinogen, UA: 0.2 mg/dL (ref 0.0–1.0)
pH: 6.5 (ref 5.0–8.0)

## 2014-08-25 LAB — WET PREP, GENITAL
CLUE CELLS WET PREP: NONE SEEN
Trich, Wet Prep: NONE SEEN
Yeast Wet Prep HPF POC: NONE SEEN

## 2014-08-25 LAB — POCT PREGNANCY, URINE: PREG TEST UR: POSITIVE — AB

## 2014-08-25 LAB — HCG, QUANTITATIVE, PREGNANCY: HCG, BETA CHAIN, QUANT, S: 31078 m[IU]/mL — AB (ref <5)

## 2014-08-25 MED ORDER — PRENATAL MULTIVITAMIN CH: 1.0000 | ORAL_TABLET | Freq: Every day | ORAL | Status: DC

## 2014-08-25 NOTE — MAU Note (Signed)
Patient states she has had a positive home pregnancy test. Has had some nausea, no vomiting. Some light brown discharge. Denies pain.

## 2014-08-25 NOTE — MAU Provider Note (Signed)
History     CSN: [id number]  Arrival date and time: 08/25/14 1416   First Provider Initiated Contact with Patient 08/25/14 1623      Chief Complaint  Patient presents with  . Possible Pregnancy  . Nausea   HPI 28 yo G1011 @ 15+4 by very unsure LMP (has irregular periods, last in June) presents with vaginal bleeding.  Had positive home pregnancy test 5 days ago. Presents today because 5 days ago noticed small amount of blood-streaked mucous in underwear that persisted for 3 days. Last 2 days no blood. No pain, no fevers, no dysuria or urinary frequency. No increased discharge, no vaginal pain or burning or pruritus. No cramping, no abdominal pain. Has mild occasional nausea that she associates with pregnancy present for one week. No vomiting, no diarrhea. Monogamous with husband, one sexual partner this past year.  No history abnormal pap.   Past Medical History  Diagnosis Date  . Asthma     as chlid  . Anemia   . Urinary tract infection   . Hx of varicella   . Fibroid   . Late prenatal care     Past Surgical History  Procedure Laterality Date  . No past surgeries      Family History  Problem Relation Age of Onset  . Anesthesia problems Neg Hx   . Hypertension Father     History  Substance Use Topics  . Smoking status: Never Smoker   . Smokeless tobacco: Never Used  . Alcohol Use: No    Allergies: No Known Allergies  Prescriptions prior to admission  Medication Sig Dispense Refill  . ferrous sulfate 325 (65 FE) MG tablet Take 325 mg by mouth daily.      Marland Kitchen ibuprofen (ADVIL,MOTRIN) 200 MG tablet Take 200 mg by mouth daily as needed for headache.      . Prenatal Vit-Fe Fumarate-FA (PRENATAL MULTIVITAMIN) TABS tablet Take 1 tablet by mouth daily.        Review of Systems  Constitutional: Negative for fever and chills.  Eyes: Negative for blurred vision.  Respiratory: Negative for cough.   Cardiovascular: Negative for chest pain, palpitations and leg  swelling.  Gastrointestinal: Positive for nausea. Negative for heartburn, vomiting, abdominal pain, diarrhea, constipation and blood in stool.  Genitourinary: Negative for dysuria, urgency, frequency, hematuria and flank pain.  Musculoskeletal: Negative for back pain and myalgias.  Skin: Negative for rash.  Neurological: Negative for dizziness and headaches.   Physical Exam   Blood pressure 111/61, pulse 71, temperature 98.5 F (36.9 C), temperature source Oral, resp. rate 16, height 5' 2.25" (1.581 m), weight 68.584 kg (151 lb 3.2 oz), last menstrual period 05/08/2014, SpO2 99.00%, unknown if currently breastfeeding.  Physical Exam  Constitutional: She appears well-developed and well-nourished.  HENT:  Head: Normocephalic and atraumatic.  Eyes: Conjunctivae are normal.  Cardiovascular: Normal rate, regular rhythm and normal heart sounds.   Respiratory: Effort normal and breath sounds normal.  GI: Soft. Bowel sounds are normal. She exhibits no distension and no mass. There is no tenderness. There is no rebound and no guarding.  Genitourinary:  External genitalia wnl, mild amount white discharge, normal cervix, no cervical/uterine/adnexal tenderness, retroverted uterus just palpable at level of pubic symphisis. No adnexal mass.  Neurological: She is alert.  Skin: Skin is warm.  Psychiatric: She has a normal mood and affect. Her behavior is normal.    MAU Course  Procedures  Results for orders placed during the hospital encounter of 08/25/14 (from  the past 24 hour(s))  URINALYSIS, ROUTINE W REFLEX MICROSCOPIC     Status: None   Collection Time    08/25/14  3:00 PM      Result Value Ref Range   Color, Urine YELLOW  YELLOW   APPearance CLEAR  CLEAR   Specific Gravity, Urine 1.015  1.005 - 1.030   pH 6.5  5.0 - 8.0   Glucose, UA NEGATIVE  NEGATIVE mg/dL   Hgb urine dipstick NEGATIVE  NEGATIVE   Bilirubin Urine NEGATIVE  NEGATIVE   Ketones, ur NEGATIVE  NEGATIVE mg/dL   Protein,  ur NEGATIVE  NEGATIVE mg/dL   Urobilinogen, UA 0.2  0.0 - 1.0 mg/dL   Nitrite NEGATIVE  NEGATIVE   Leukocytes, UA NEGATIVE  NEGATIVE  POCT PREGNANCY, URINE     Status: Abnormal   Collection Time    08/25/14  3:25 PM      Result Value Ref Range   Preg Test, Ur POSITIVE (*) NEGATIVE  HCG, QUANTITATIVE, PREGNANCY     Status: Abnormal   Collection Time    08/25/14  4:09 PM      Result Value Ref Range   hCG, Beta Chain, America Brown, Chauncey Cruel [address] (*) <5 mIU/mL  CBC     Status: Abnormal   Collection Time    08/25/14  4:20 PM      Result Value Ref Range   WBC 5.6  4.0 - 10.5 K/uL   RBC 3.71 (*) 3.87 - 5.11 MIL/uL   Hemoglobin 11.3 (*) 12.0 - 15.0 g/dL   HCT 31.2 (*) 36.0 - 46.0 %   MCV 84.1  78.0 - 100.0 fL   MCH 30.5  26.0 - 34.0 pg   MCHC 36.2 (*) 30.0 - 36.0 g/dL   RDW 13.0  11.5 - 15.5 %   Platelets 236  150 - 400 K/uL  WET PREP, GENITAL     Status: Abnormal   Collection Time    08/25/14  4:39 PM      Result Value Ref Range   Yeast Wet Prep HPF POC NONE SEEN  NONE SEEN   Trich, Wet Prep NONE SEEN  NONE SEEN   Clue Cells Wet Prep HPF POC NONE SEEN  NONE SEEN   WBC, Wet Prep HPF POC FEW (*) NONE SEEN   Transvaginal ultrasound FINDINGS:  Intrauterine gestational sac: Visualized/normal in shape.  Yolk sac: Visualized.  Embryo: Visualized.  Cardiac Activity: Visualized.  Heart Rate: 120 bpm  CRL: 13.9 mm 6 w 6 d [place] Day: Apr 14, 2015.  Maternal uterus/adnexae: Normal bilateral ovaries. Calcified fibroid  seen in the uterus posteriorly. No free fluid is noted.  IMPRESSION:  Single live intrauterine gestation of 6 weeks 6 days.   Assessment and Plan  28 yo G1011 @ 6+6 by ultrasound performed today presenting with first trimester vaginal bleeding.  # Threatened miscarriage - Small amount of bleeding now stopped. No blood in vagina or at cervix, os closed. HCG above discriminatory zone. Ultrasound shows viable IUP with positive cardiac activity. Rh negative, hemaglobin 11s with history  anemia. No ectopic visualized on u/s. - bleeding return cautions provided - planning to establish care in [facility], [place], where she will move in less than 2 weeks - start prenatal vitamins (prescribed today) - continue home ferrous sulfate - pregnancy verification letter provided    Bonner Puna 08/25/2014, 4:24 PM

## 2014-08-25 NOTE — MAU Note (Signed)
Pt states her baby was born 11[birth year] and states her cycles have been really irregular since.  Last LMP was 05-08-14 to the best of her knowledge but thinks she may only be about a month pregnant.

## 2014-08-25 NOTE — Discharge Instructions (Signed)

## 2014-08-26 LAB — GC/CHLAMYDIA PROBE AMP
CT Probe RNA: NEGATIVE
GC Probe RNA: NEGATIVE

## 2014-09-19 ENCOUNTER — Encounter (HOSPITAL_COMMUNITY): Payer: Self-pay | Admitting: *Deleted

## 2014-11-29 IMAGING — US US OB COMP +14 WK
1 series · 12 of 28 positions shown · non-contrast
Comparison: none

[Series 1: us ob comp +14 wk · 12 of 78 slices shown]
[im 3/78]
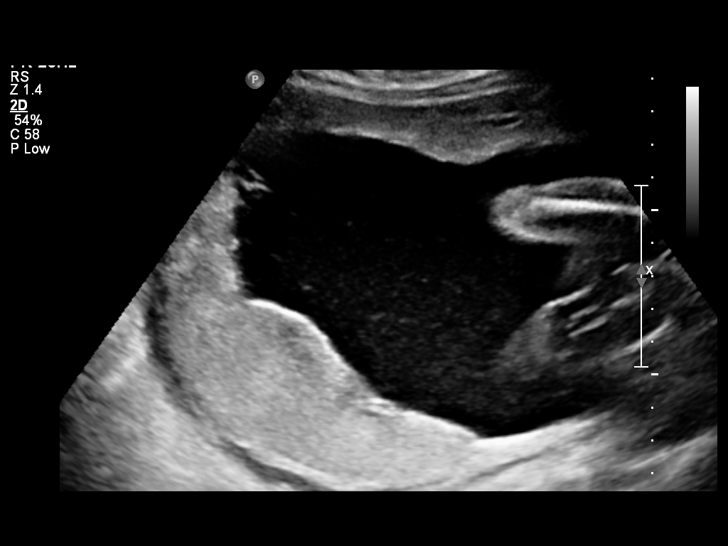
[im 9/78]
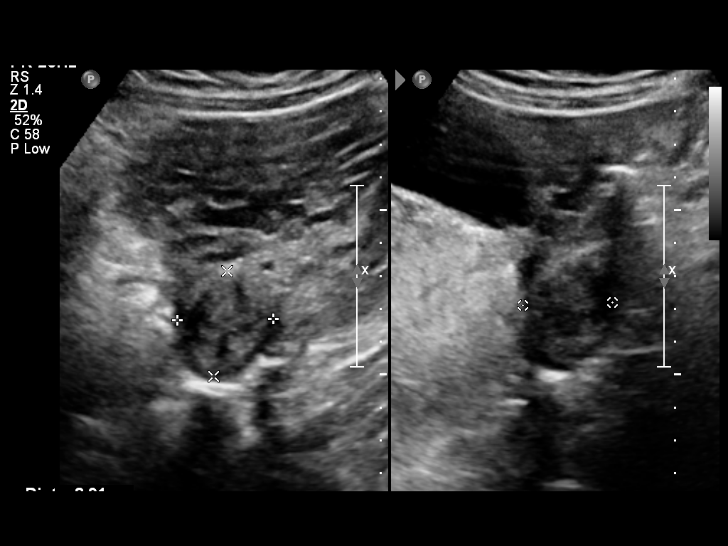
[im 15/78]
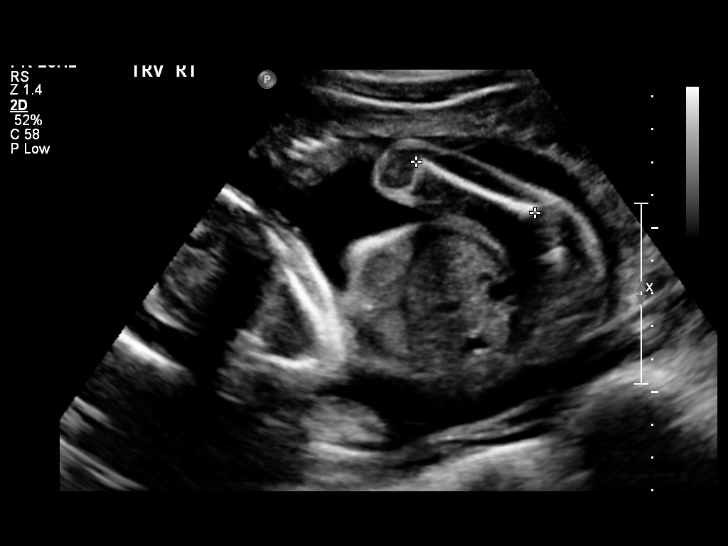
[im 23/78]
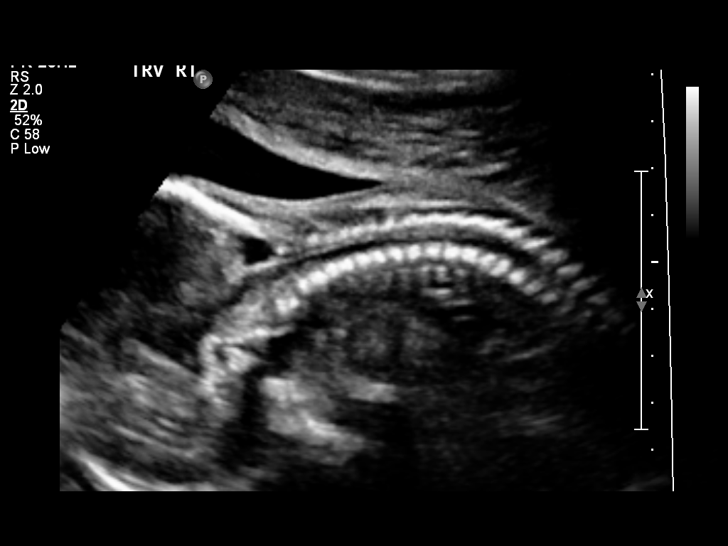
[im 29/78]
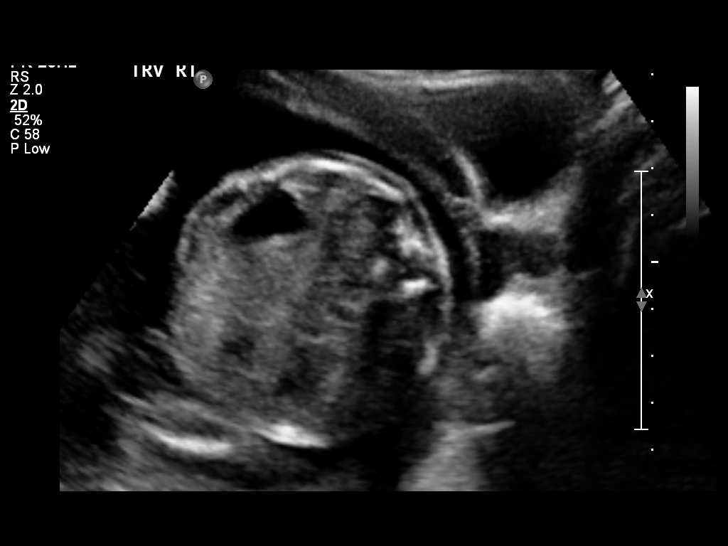
[im 35/78]
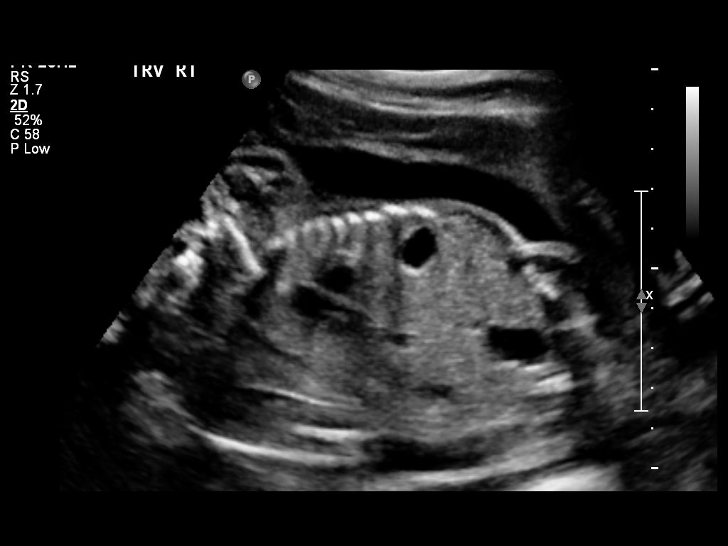
[im 43/78]
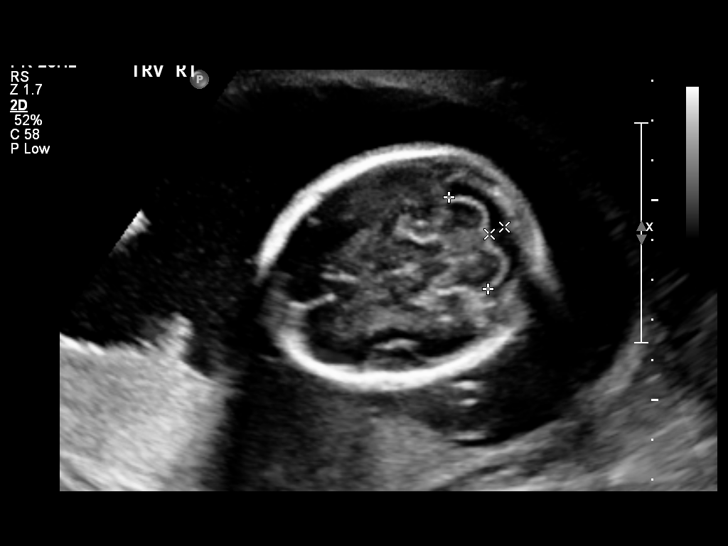
[im 49/78]
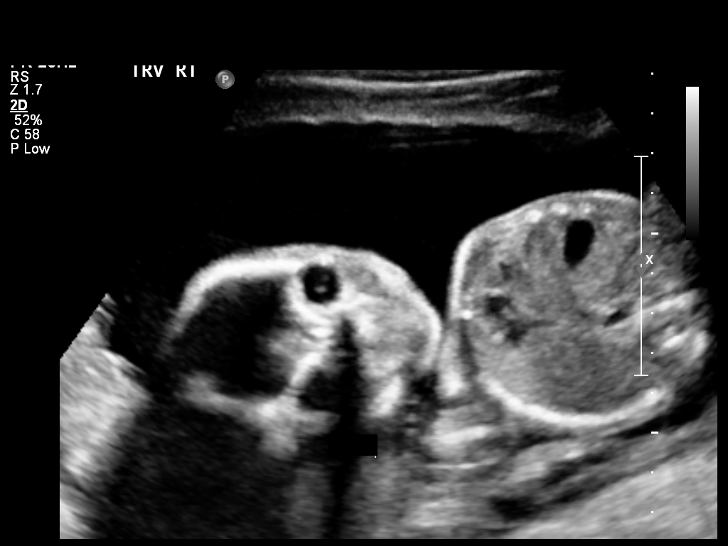
[im 55/78]
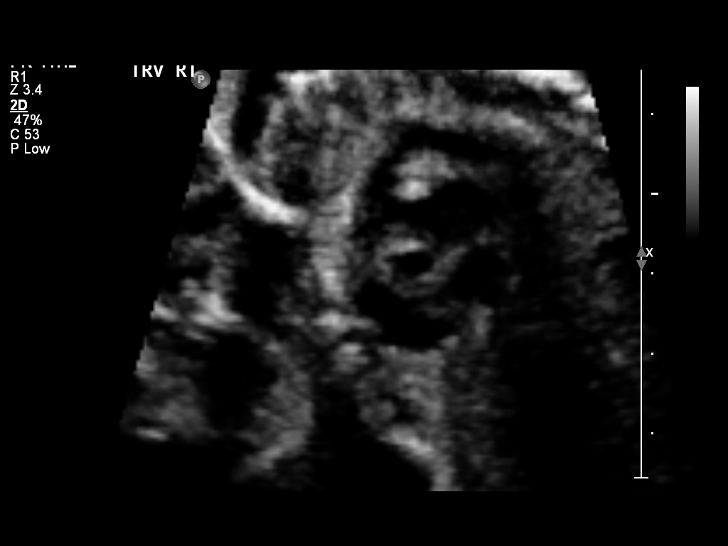
[im 63/78]
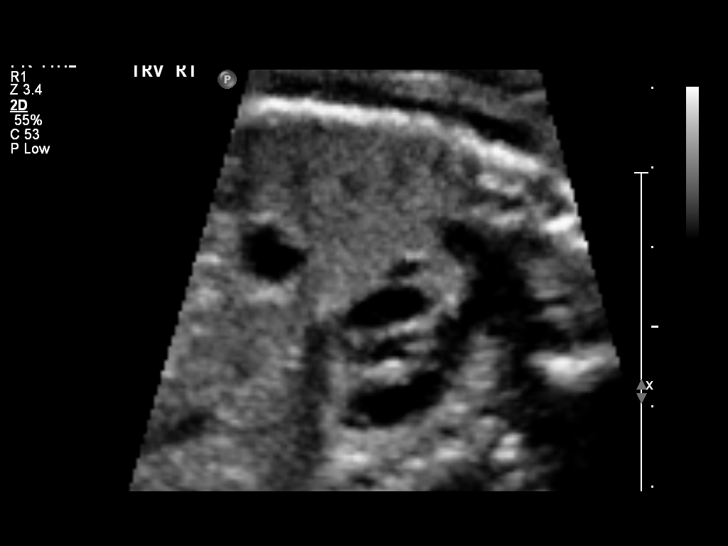
[im 69/78]
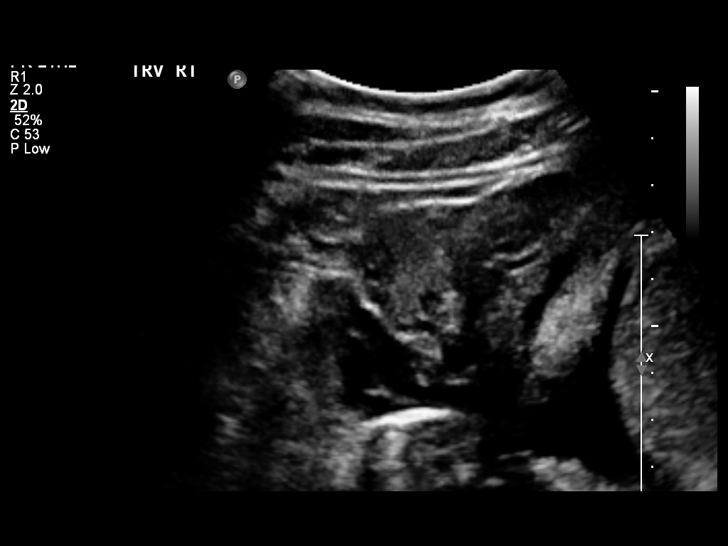
[im 75/78]
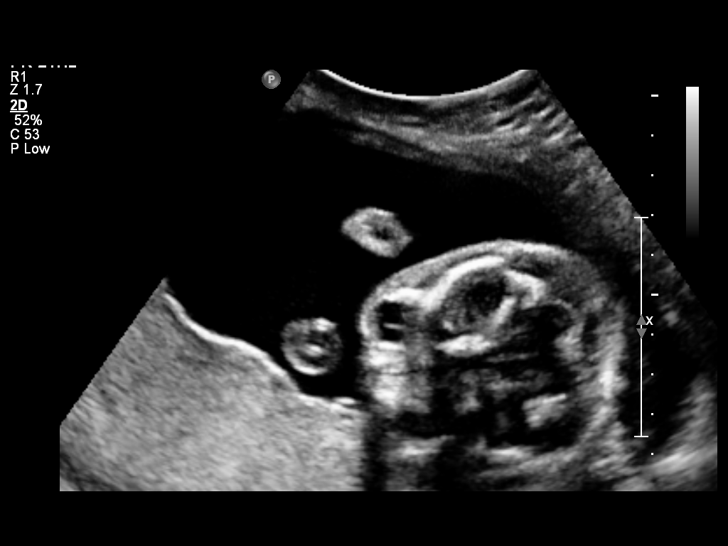

[12 of 28 positions shown; findings below may reference images not displayed]

OBSTETRICS REPORT
                      (Signed Final 07/06/2013 [DATE])

Service(s) Provided

 US OB COMP + 14 WK                                    76805.1
Indications

 Basic anatomic survey
 Uterine fibroids
Fetal Evaluation

 Num Of Fetuses:    1
 Fetal Heart Rate:  147                         bpm
 Cardiac Activity:  Observed
 Presentation:      Variable
 Placenta:          Posterior Fundal, above
                    cervical os
 P. Cord            Visualized
 Insertion:

 Amniotic Fluid
 AFI FV:      Subjectively upper-normal
                                             Larg Pckt:     7.7  cm
Biometry

 BPD:     55.4  mm    G. Age:   22w 6d                CI:        69.96   70 - 86
                                                      FL/HC:      18.6   18.4 -

 HC:     211.3  mm    G. Age:   23w 2d       69  %    HC/AC:      1.12   1.06 -

 AC:     188.8  mm    G. Age:   23w 5d       78  %    FL/BPD:     70.9   71 - 87
 FL:      39.3  mm    G. Age:   22w 4d       47  %    FL/AC:      20.8   20 - 24
 CER:     24.9  mm    G. Age:   22w 6d       57  %

 Est. FW:     571  gm      1 lb 4 oz     61  %
Gestational Age

 U/S Today:     23w 1d                                        EDD:   11/01/13
 Best:          22w 3d    Det. By:   Previous Ultrasound      EDD:   11/06/13
Anatomy

 Cranium:          Appears normal         Aortic Arch:      Appears normal
 Fetal Cavum:      Appears normal         Ductal Arch:      Appears normal
 Ventricles:       Appears normal         Diaphragm:        Appears normal
 Choroid Plexus:   Appears normal         Stomach:          Appears normal
 Cerebellum:       Appears normal         Abdomen:          Appears normal
 Posterior Fossa:  Appears normal         Abdominal Wall:   Appears nml (cord
                                                            insert, abd wall)
 Nuchal Fold:      Not applicable (>20    Cord Vessels:     Appears normal (3
                   wks GA)                                  vessel cord)
 Face:             Orbits appear          Kidneys:          Appear normal
                   normal
 Lips:             Appears normal         Bladder:          Appears normal
 Heart:            Appears normal         Spine:            Appears normal
                   (4CH, axis, and
                   situs)
 RVOT:             Appears normal         Lower             Appears normal
                                          Extremities:
 LVOT:             Appears normal         Upper             Appears normal
                                          Extremities:

 Other:  Male gender.
Targeted Anatomy

 Fetal Central Nervous System
 Cisterna Magna:
Cervix Uterus Adnexa

 Cervical Length:   4.1       cm

 Cervix:       Normal appearance by transabdominal scan.
 Left Ovary:   Within normal limits.
 Right Ovary:  Within normal limits.
Myomas

 Site                     L(cm)      W(cm)      D(cm)       Location
 Left                     3.2        2.9        2.7         Subserosal

 Blood Flow                  RI       PI       Comments

Impression

 Single living intrauterine pregnancy in variable presentation.
 The estimated gestational age is 22w 3d based on Previous
 Ultrasound . No fetal anomalies are identified. There has
 been appropriate growth since prior ultrasound.

## 2015-06-30 ENCOUNTER — Encounter (HOSPITAL_COMMUNITY): Payer: Self-pay | Admitting: *Deleted

## 2016-01-18 IMAGING — US US OB TRANSVAGINAL
1 series · 14 of 28 positions shown · non-contrast
Comparison: None.

CLINICAL DATA: Vaginal bleeding during pregnancy.

EXAM:
OBSTETRIC <14 WK US AND TRANSVAGINAL OB US
TECHNIQUE: Both transabdominal and transvaginal ultrasound examinations were
performed for complete evaluation of the gestation as well as the
maternal uterus, adnexal regions, and pelvic cul-de-sac.
Transvaginal technique was performed to assess early pregnancy.

[Series 1: us mfm ob comp less than 14 weeks · 43 acquisitions, 14 frames shown]
[im 2/43]
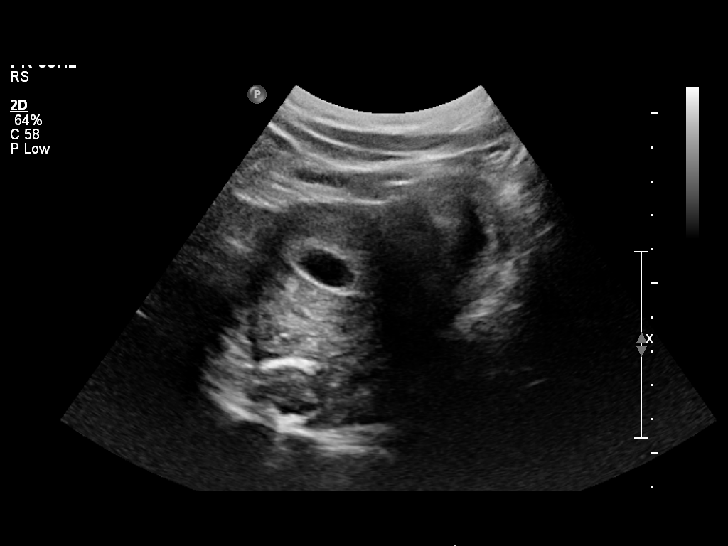
[im 5/43]
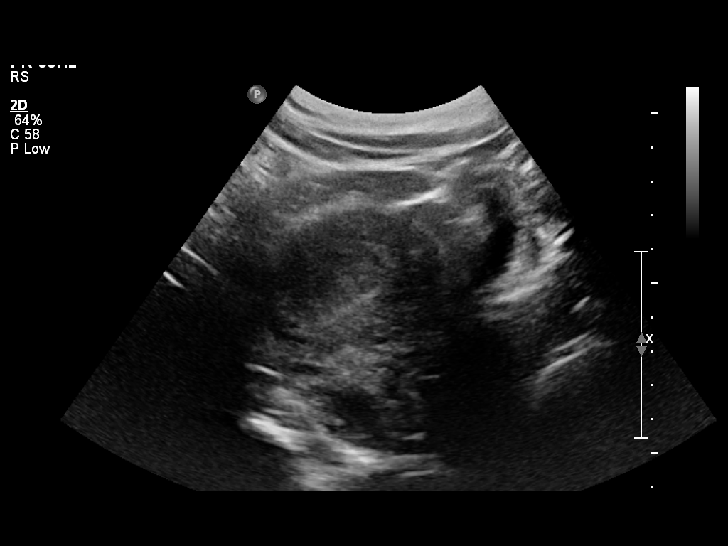
[im 8/43]
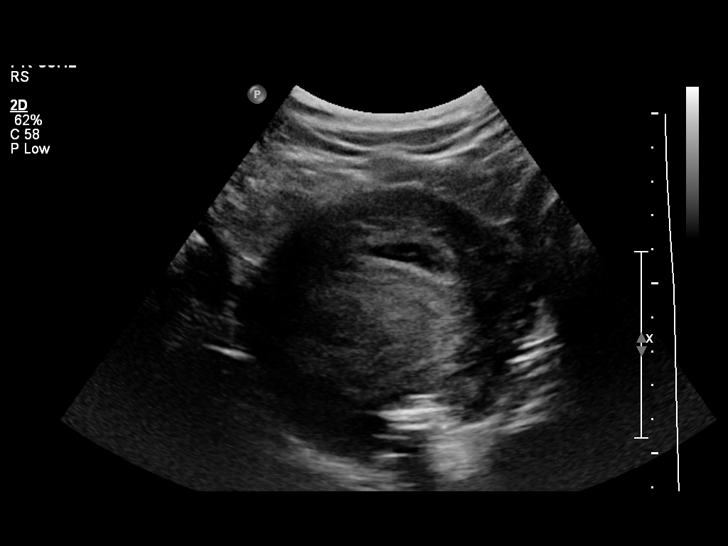
[im 11/43]
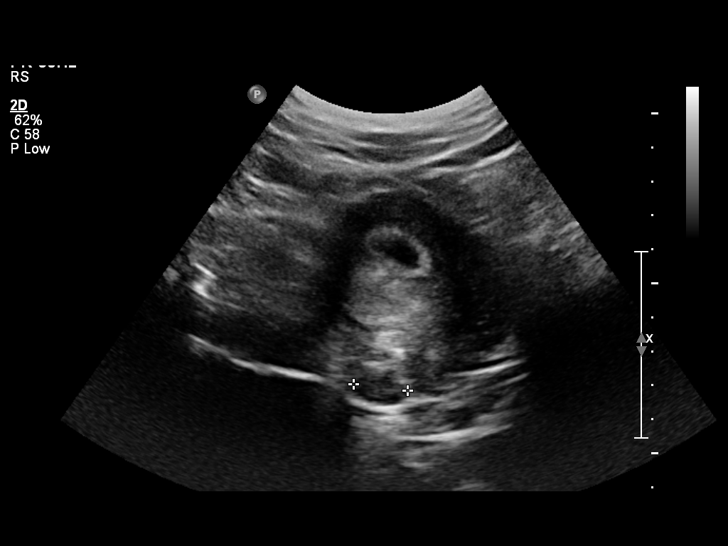
[im 15/43]
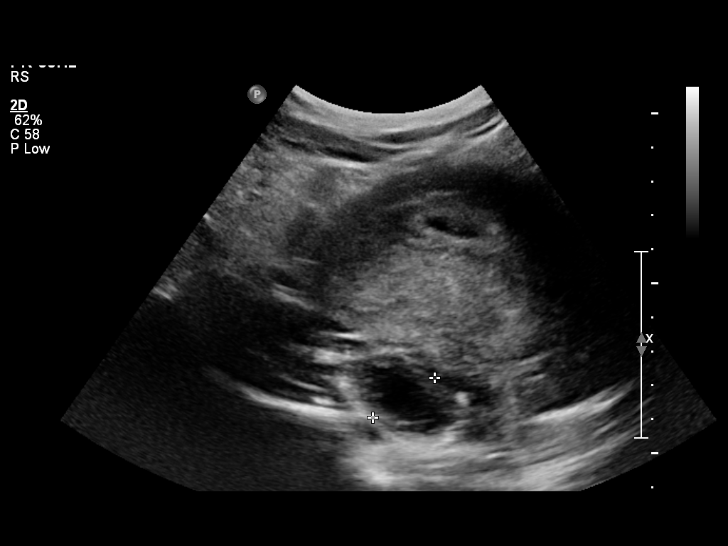
[im 18/43]
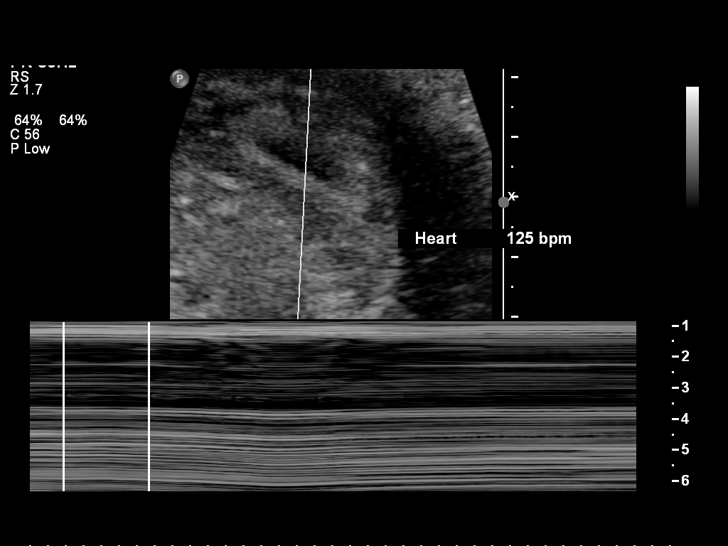
[im 21/43]
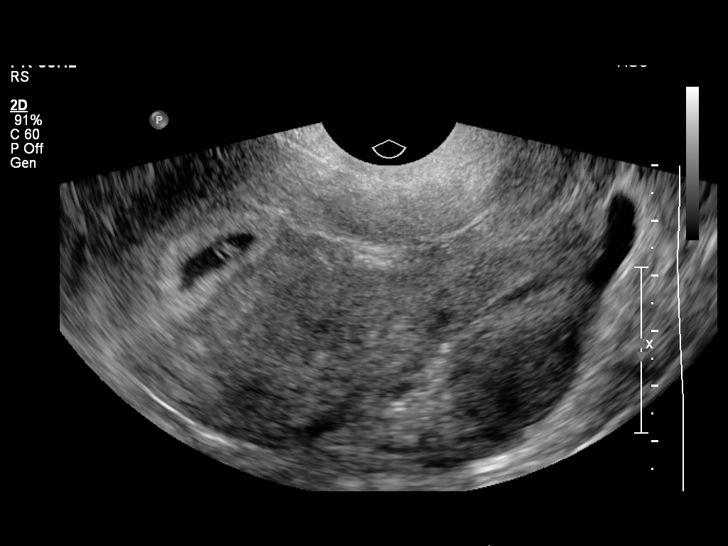
[im 24/43]
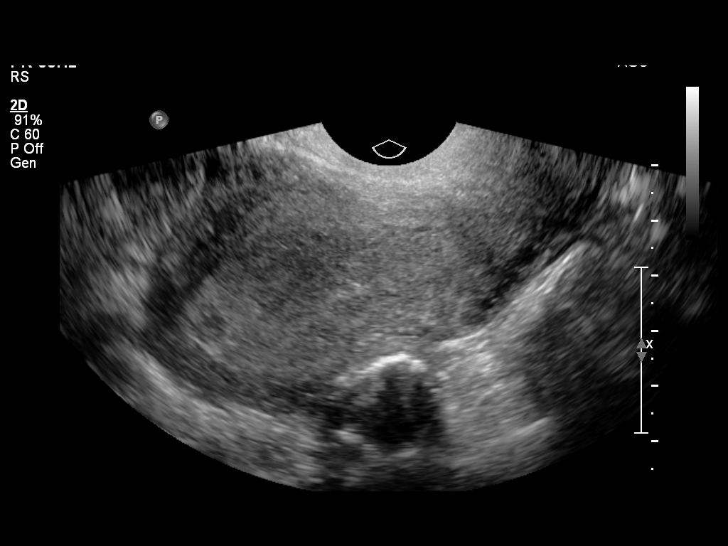
[im 27/43]
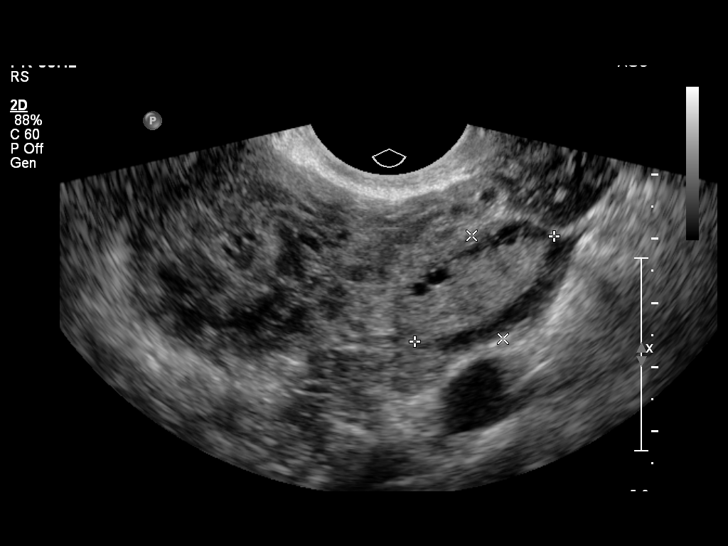
[im 30/43]
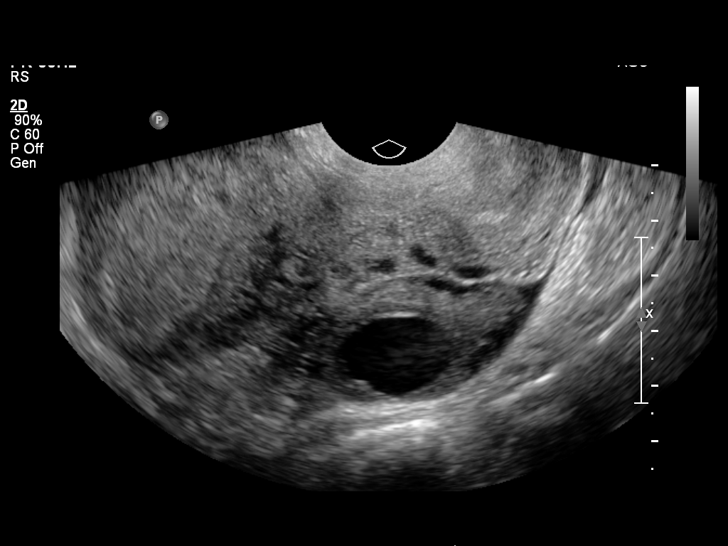
[im 33/43]
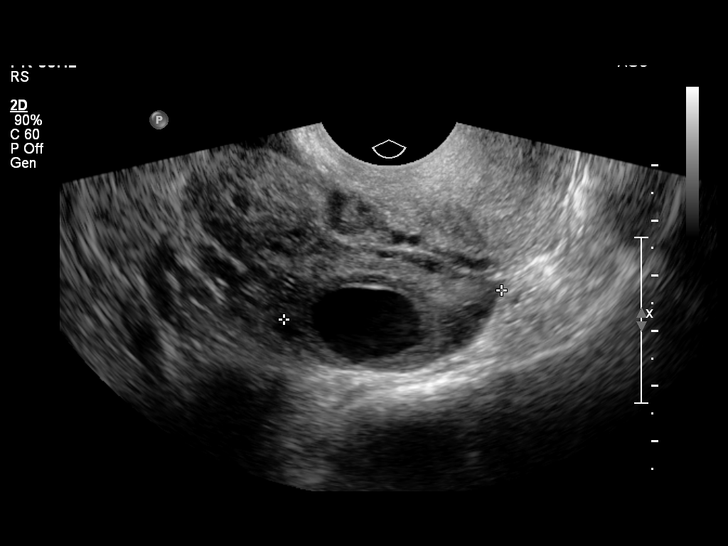
[im 36/43]
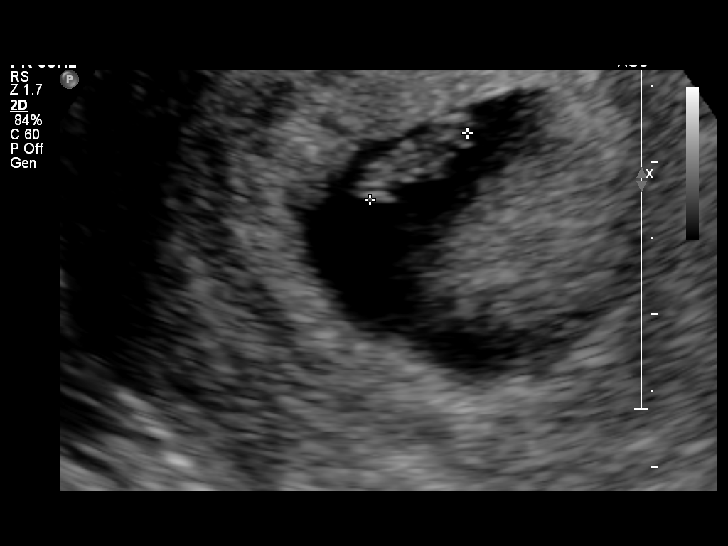
[im 39/43]
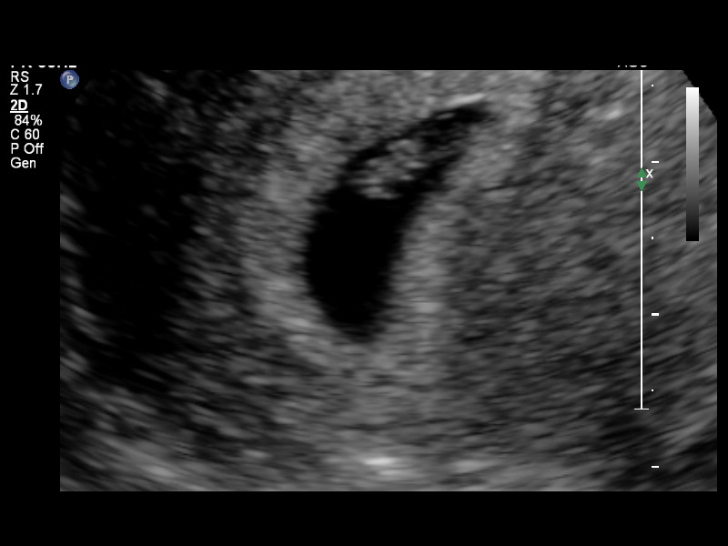
[im 43/43]
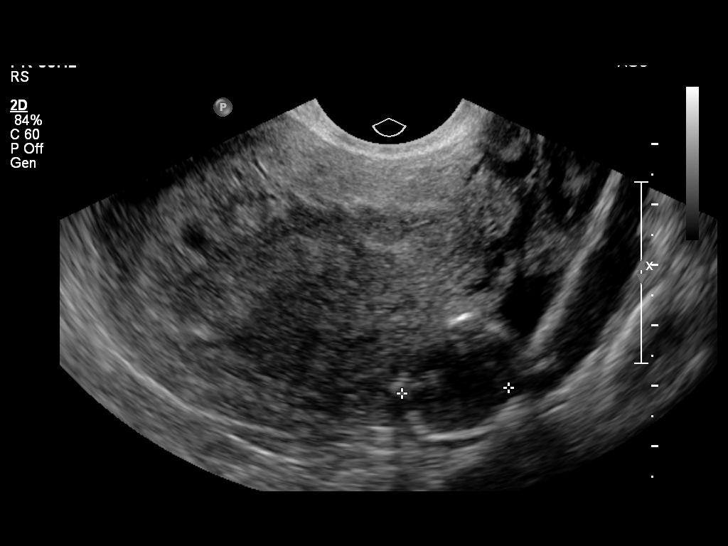

[14 of 28 positions shown; findings below may reference images not displayed]

FINDINGS: Intrauterine gestational sac: Visualized/normal in shape.

Yolk sac:  Visualized.

Embryo:  Visualized.

Cardiac Activity: Visualized.

Heart Rate:  120 bpm

CRL:   13.9  mm   6 w 6 d                  US EDC: April 14, 2015.

Maternal uterus/adnexae: Normal bilateral ovaries. Calcified fibroid
seen in the uterus posteriorly. No free fluid is noted.
IMPRESSION: Single live intrauterine gestation of 6 weeks 6 days.

## 2019-07-15 ENCOUNTER — Encounter (HOSPITAL_COMMUNITY): Payer: Self-pay

## 2019-07-15 ENCOUNTER — Other Ambulatory Visit: Payer: Self-pay

## 2019-07-15 ENCOUNTER — Ambulatory Visit (HOSPITAL_COMMUNITY)
Admission: EM | Admit: 2019-07-15 | Discharge: 2019-07-15 | Disposition: A | Discharge status: Home / Self Care | Payer: PRIVATE HEALTH INSURANCE | Attending: Family Medicine | Admitting: Family Medicine

## 2019-07-15 DIAGNOSIS — L299 Pruritus, unspecified: Secondary | ICD-10-CM

## 2019-07-15 MED ORDER — PRENATAL MULTIVITAMIN CH: 1.0000 | ORAL_TABLET | Freq: Every day | ORAL | 3 refills | Status: DC

## 2019-07-15 NOTE — Discharge Instructions (Addendum)
Take zyrtec 2 x a day for a couple of weeks until the itch goes away Bathe in lukewarm - NOT HOT water Pay attention to your soaps and products Use an anti itch cream like Sarna sensitive or CeraVE itch

## 2019-07-15 NOTE — ED Triage Notes (Signed)
Patient presents to Urgent Care with complaints of itching in her vaginal area since a little over a week ago, that she treated w/ otc meds. Patient reports her private areas are not itchy any more but the rest of her body is and has been for three days.

## 2019-07-15 NOTE — ED Provider Notes (Signed)
Todd    CSN: [id number] Arrival date & time: 07/15/19  1652      History   Chief Complaint Chief Complaint  Patient presents with  . Pruritis    HPI Christine Taylor is a 33 y.o. female.   HPI Patient states that she had a yeast infection.  Vaginal itching discharge.  She went toThe pharmacy and got an over-the-counter preparation.  She used it per package instructions.  It worked well.  She states a couple days later she started having generalized itching her skin.  She has been having itching ever since then.  It itches worse some days than others.  There is no rash.  It does not seem to matter which soap she uses, which lotion, which soap powder.  She is tried to change products.  She does not have any reason she would be itching.  She has no known allergies.  No known medical conditions, problems with her liver or kidneys or diabetes. Past Medical History:  Diagnosis Date  . Anemia   . Asthma    as chlid  . Fibroid   . Hx of varicella   . Late prenatal care   . Urinary tract infection     Patient Active Problem List   Diagnosis Date Noted  . Variable fetal heart rate decelerations, antepartum 11/11/2013  . SAB (spontaneous abortion) 04/15/2012  . OTITIS MEDIA, LEFT 11/14/2008  . GLYCOSURIA 11/14/2008  . PRURITUS 08/04/2008  . ANEMIA 07/01/2008  . ALLERGIC RHINITIS 06/30/2008  . ASTHMA, CHILDHOOD 06/30/2008  . GERD 06/30/2008  . HEAT RASH 06/30/2008  . FATIGUE 06/30/2008    Past Surgical History:  Procedure Laterality Date  . NO PAST SURGERIES      OB History    Gravida  3   Para  1   Term  1   Preterm      AB  1   Living  1     SAB  1   TAB      Ectopic      Multiple      Live Births  1            Home Medications    Prior to Admission medications   Medication Sig Start Date End Date Taking? Authorizing Provider  ferrous sulfate 325 (65 FE) MG tablet Take 325 mg by mouth daily.    [provider]     Family History Family History  Problem Relation Age of Onset  . Hypertension Father   . Diabetes Mother   . Anesthesia problems Neg Hx     Social History Social History   Tobacco Use  . Smoking status: Never Smoker  . Smokeless tobacco: Never Used  Substance Use Topics  . Alcohol use: No  . Drug use: No     Allergies   Patient has no known allergies.   Review of Systems Review of Systems  Constitutional: Negative for chills and fever.  HENT: Negative for ear pain and sore throat.   Eyes: Negative for pain and visual disturbance.  Respiratory: Negative for cough and shortness of breath.   Cardiovascular: Negative for chest pain and palpitations.  Gastrointestinal: Negative for abdominal pain and vomiting.  Genitourinary: Negative for dysuria and hematuria.  Musculoskeletal: Negative for arthralgias and back pain.  Skin: Negative for color change and rash.       Itching  Neurological: Negative for seizures and syncope.  All other systems reviewed and are negative.  Physical Exam Triage Vital Signs ED Triage Vitals  Enc Vitals Group     BP 07/15/19 1715 113/86     Pulse Rate 07/15/19 1715 86     Resp 07/15/19 1715 17     Temp 07/15/19 1715 98.2 F (36.8 C)     Temp Source 07/15/19 1715 Oral     SpO2 07/15/19 1715 100 %     Weight --      Height --      Head Circumference --      Peak Flow --      Pain Score 07/15/19 1714 0     Pain Loc --      Pain Edu? --      Excl. in [place]? --    No data found.  Updated Vital Signs BP 113/86 (BP Location: Left Arm)   Pulse 86   Temp 98.2 F (36.8 C) (Oral)   Resp 17   SpO2 100%   Visual Acuity Right Eye Distance:   Left Eye Distance:   Bilateral Distance:    Right Eye Near:   Left Eye Near:    Bilateral Near:     Physical Exam Constitutional:      General: She is not in acute distress.    Appearance: She is well-developed.  HENT:     Head: Normocephalic and atraumatic.  Eyes:      Conjunctiva/sclera: Conjunctivae normal.     Pupils: Pupils are equal, round, and reactive to light.  Neck:     Musculoskeletal: Normal range of motion.  Cardiovascular:     Rate and Rhythm: Normal rate.  Pulmonary:     Effort: Pulmonary effort is normal. No respiratory distress.  Abdominal:     General: There is no distension.     Palpations: Abdomen is soft.  Musculoskeletal: Normal range of motion.  Skin:    General: Skin is warm and dry.     Comments: Skin perfectly clear  Neurological:     Mental Status: She is alert.  Psychiatric:        Mood and Affect: Mood normal.        Behavior: Behavior normal.      UC Treatments / Results  Labs (all labs ordered are listed, but only abnormal results are displayed) Labs Reviewed - No data to display  EKG   Radiology No results found.  Procedures Procedures (including critical care time)  Medications Ordered in UC Medications - No data to display  Initial Impression / Assessment and Plan / UC Course  I have reviewed the triage vital signs and the nursing notes.  Pertinent labs & imaging results that were available during my care of the patient were reviewed by me and considered in my medical decision making (see chart for details).     Discussed that is very difficult to determine whether patient might be sensitive or allergic to.  Process of illumination.  Diarrhea may help.  Discussed methods to help treat the itching. Final Clinical Impressions(s) / UC Diagnoses   Final diagnoses:  Pruritus     Discharge Instructions     Take zyrtec 2 x a day for a couple of weeks until the itch goes away Bathe in lukewarm - NOT HOT water Pay attention to your soaps and products Use an anti itch cream like Sarna sensitive or CeraVE itch     Controlled Substance Prescriptions [place] Controlled Substance Registry consulted? Not Applicable   Raylene Everts, MD 07/15/19 2004

## 2020-06-17 ENCOUNTER — Ambulatory Visit: Payer: Self-pay

## 2020-08-24 ENCOUNTER — Encounter: Payer: Self-pay | Admitting: Family Medicine

## 2020-09-14 ENCOUNTER — Ambulatory Visit: Payer: 59 | Admitting: Family Medicine

## 2020-09-22 ENCOUNTER — Ambulatory Visit: Payer: 59 | Admitting: Family Medicine

## 2020-11-22 ENCOUNTER — Other Ambulatory Visit (HOSPITAL_COMMUNITY)
Admission: RE | Admit: 2020-11-22 | Discharge: 2020-11-22 | Disposition: A | Discharge status: Home / Self Care | Payer: 59 | Source: Ambulatory Visit | Attending: Advanced Practice Midwife | Admitting: Advanced Practice Midwife

## 2020-11-22 ENCOUNTER — Ambulatory Visit (INDEPENDENT_AMBULATORY_CARE_PROVIDER_SITE_OTHER): Payer: 59 | Admitting: Advanced Practice Midwife

## 2020-11-22 ENCOUNTER — Other Ambulatory Visit: Payer: Self-pay

## 2020-11-22 ENCOUNTER — Encounter: Payer: Self-pay | Admitting: Advanced Practice Midwife

## 2020-11-22 VITALS — BP 134/88 | HR 81 | Wt 151.0 lb

## 2020-11-22 DIAGNOSIS — Z30432 Encounter for removal of intrauterine contraceptive device: Secondary | ICD-10-CM | POA: Diagnosis not present

## 2020-11-22 DIAGNOSIS — Z01419 Encounter for gynecological examination (general) (routine) without abnormal findings: Secondary | ICD-10-CM | POA: Diagnosis not present

## 2020-11-22 NOTE — Progress Notes (Signed)
GYNECOLOGY ANNUAL PREVENTATIVE CARE ENCOUNTER NOTE  Subjective:   Christine Taylor is a 35 y.o. [phone] female here for a routine annual gynecologic exam.  Current complaints: none.   Denies abnormal vaginal bleeding, discharge, pelvic pain, problems with intercourse or other gynecologic concerns.     Patient has IUD at this time that was placed in 2018. She would like to have it removed. She reports that she is not currently sexually active, and if that changes she will come back for something else for birth control.   Gynecologic History No LMP recorded. Contraception: IUD Last Pap: 2018. Results were: normal per patient.  Last mammogram: NA, age   Obstetric History OB History  Gravida Para Term Preterm AB Living  4 3 3   1 3   SAB IAB Ectopic Multiple Live Births  1       3    # Outcome Date GA Lbr Len/2nd Weight Sex Delivery Anes PTL Lv  4 Term 07/23/17    M Vag-Spont None  LIV  3 Term 04/12/15    M Vag-Spont   LIV  2 Term 11/11/13    M Vag-Spont None  LIV  1 SAB             Past Medical History:  Diagnosis Date  . Anemia     History reviewed. No pertinent surgical history.  Current Outpatient Medications on File Prior to Visit  Medication Sig Dispense Refill  . ferrous sulfate 325 (65 FE) MG tablet Take 325 mg by mouth daily with breakfast.    . Multiple Vitamin (MULTIVITAMIN) tablet Take 1 tablet by mouth daily.     No current facility-administered medications on file prior to visit.    No Known Allergies  Social History   Socioeconomic History  . Marital status: Married    Spouse name: Not on file  . Number of children: Not on file  . Years of education: Not on file  . Highest education level: Not on file  Occupational History  . Not on file  Tobacco Use  . Smoking status: Never Smoker  . Smokeless tobacco: Never Used  Substance and Sexual Activity  . Alcohol use: Not Currently  . Drug use: Never  . Sexual activity: Not Currently    Birth  control/protection: I.U.D.    Comment: Removing 11/22/2020  Other Topics Concern  . Not on file  Social History Narrative  . Not on file   Social Determinants of Health   Financial Resource Strain: Not on file  Food Insecurity: Not on file  Transportation Needs: Not on file  Physical Activity: Not on file  Stress: Not on file  Social Connections: Not on file  Intimate Partner Violence: Not on file    Family History  Problem Relation Age of Onset  . Hypertension Father   . Diabetes Mother     The following portions of the patient's history were reviewed and updated as appropriate: allergies, current medications, past family history, past medical history, past social history, past surgical history and problem list.  Review of Systems Pertinent items noted in HPI and remainder of comprehensive ROS otherwise negative.   Objective:  BP 134/88   Pulse 81   Wt 151 lb (68.5 kg)  CONSTITUTIONAL: Well-developed, well-nourished female in no acute distress.  HENT:  Normocephalic, atraumatic, External right and left ear normal. Oropharynx is clear and moist EYES: Conjunctivae and EOM are normal. Pupils are equal, round, and reactive to light. No scleral icterus.  NECK: Normal range of motion, supple, no masses.  Normal thyroid.  SKIN: Skin is warm and dry. No rash noted. Not diaphoretic. No erythema. No pallor. NEUROLOGIC: Alert and oriented to person, place, and time. Normal reflexes, muscle tone coordination. No cranial nerve deficit noted. PSYCHIATRIC: Normal mood and affect. Normal behavior. Normal judgment and thought content. CARDIOVASCULAR: Normal heart rate noted, regular rhythm RESPIRATORY: Clear to auscultation bilaterally. Effort and breath sounds normal, no problems with respiration noted. ABDOMEN: Soft, normal bowel sounds, no distention noted.  No tenderness, rebound or guarding.  PELVIC: Normal appearing external genitalia; normal appearing vaginal mucosa and cervix.  No  abnormal discharge noted.  Pap smear obtained.  Normal uterine size, no other palpable masses, no uterine or adnexal tenderness. MUSCULOSKELETAL: Normal range of motion. No tenderness.  No cyanosis, clubbing, or edema.  2+ distal pulses.  GYNECOLOGY OFFICE PROCEDURE NOTE   IUD Removal  Patient identified, informed consent performed, consent signed.  Patient was in the dorsal lithotomy position, normal external genitalia was noted.  A speculum was placed in the patient's vagina, normal discharge was noted, no lesions. The cervix was visualized, no lesions, no abnormal discharge.  The strings of the IUD were grasped and pulled using ring forceps. The IUD was removed in its entirety. Patient tolerated the procedure well.    Patient will use abstinence for contraception.  Assessment and Plan:   1. Encounter for IUD removal   2. Well woman exam with routine gynecological exam    IUD removed Pap today  Patient has appt with PCP tomorrow to establish care with a PCP   Will follow up results of pap smear and manage accordingly. Routine preventative health maintenance measures emphasized. Please refer to After Visit Summary for other counseling recommendations.    Thressa Sheller DNP, CNM  11/22/20  3:03 PM

## 2020-11-23 ENCOUNTER — Ambulatory Visit (INDEPENDENT_AMBULATORY_CARE_PROVIDER_SITE_OTHER): Payer: 59 | Admitting: Family Medicine

## 2020-11-23 ENCOUNTER — Encounter: Payer: Self-pay | Admitting: Family Medicine

## 2020-11-23 VITALS — BP 120/70 | HR 87 | Ht 63.5 in | Wt 153.4 lb

## 2020-11-23 DIAGNOSIS — D649 Anemia, unspecified: Secondary | ICD-10-CM

## 2020-11-23 DIAGNOSIS — Z13220 Encounter for screening for lipoid disorders: Secondary | ICD-10-CM

## 2020-11-23 DIAGNOSIS — Z1329 Encounter for screening for other suspected endocrine disorder: Secondary | ICD-10-CM | POA: Diagnosis not present

## 2020-11-23 DIAGNOSIS — Z1159 Encounter for screening for other viral diseases: Secondary | ICD-10-CM | POA: Diagnosis not present

## 2020-11-23 DIAGNOSIS — Z0000 Encounter for general adult medical examination without abnormal findings: Secondary | ICD-10-CM

## 2020-11-23 NOTE — Progress Notes (Signed)
Subjective:    Patient ID: Christine Taylor, female    DOB: 07/28/1986, 35 y.o.   MRN: [id number]  HPI Chief Complaint  Patient presents with  . new pt    New pt cpe, wants blood work to check for anemia, has bone loss in teeth. Seen obgyn yesterday.   She is new to the practice and here for a complete physical exam. Previous medical care: in [place]   Other providers: OB/GYN - Marcille Buffy   Anemia in the past. Is taking oral iron.   Social history: married. 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 Pfizer vaccines received and last one in September.   Health maintenance:  Mammogram: N/A Colonoscopy: N/A Last Gynecological Exam: yesterday  Last Dental Exam: last month  Last Eye Exam: 2019   Wears seatbelt always, smoke detectors in home and functioning, does not text while driving and feels safe in home environment.   Reviewed allergies, medications, past medical, surgical, family, and social history.   Review of Systems Review of Systems Constitutional: -fever, -chills, -sweats, -unexpected weight change,-fatigue ENT: -runny nose, -ear pain, -sore throat Cardiology:  -chest pain, -palpitations, -edema Respiratory: -cough, -shortness of breath, -wheezing Gastroenterology: -abdominal pain, -nausea, -vomiting, -diarrhea, -constipation  Hematology: -bleeding or bruising problems Musculoskeletal: -arthralgias, -myalgias, -joint swelling, -back pain Ophthalmology: -vision changes Urology: -dysuria, -difficulty urinating, -hematuria, -urinary frequency, -urgency Neurology: -headache, -weakness, -tingling, -numbness       Objective:   Physical Exam BP 120/70   Pulse 87   Ht 5' 3.5" (1.613 m)   Wt 153 lb 6.4 oz (69.6 kg)   BMI 26.75 kg/m   General Appearance:    Alert, cooperative, no distress, appears stated age   Head:    Normocephalic, without obvious abnormality, atraumatic  Eyes:    PERRL, conjunctiva/corneas clear, EOM's intact  Ears:    Normal TM's and external ear canals  Nose:   Mask on  Throat:   Mask on   Neck:   Supple, no lymphadenopathy;  thyroid:  no   enlargement/tenderness/nodules; no JVD  Back:    Spine nontender, no curvature, ROM normal, no CVA     tenderness  Lungs:     Clear to auscultation bilaterally without wheezes, rales or     ronchi; respirations unlabored  Chest Wall:    No tenderness or deformity   Heart:    Regular rate and rhythm, S1 and S2 normal, no murmur, rub   or gallop  Breast Exam:    OB/GYN     No axillary lymphadenopathy  Abdomen:     Soft, non-tender, nondistended, normoactive bowel sounds,    no masses, no hepatosplenomegaly  Genitalia:    OB/GYN  Rectal:    Not performed due to age<40 and no related complaints  Extremities:   No clubbing, cyanosis or edema  Pulses:   2+ and symmetric all extremities  Skin:   Skin color, texture, turgor normal, no rashes or lesions  Lymph nodes:   Cervical, supraclavicular, and axillary nodes normal  Neurologic:   CNII-XII intact, normal strength, sensation and gait          Psych:   Normal mood, affect, hygiene and grooming.         Assessment & Plan:  Routine general medical examination at a health care facility - Plan: CBC with Differential/Platelet, Comprehensive metabolic  panel, TSH -She is new to the practice and here establish care.  Preventive health care reviewed.  She saw her OB/GYN yesterday.  Counseling on healthy lifestyle including diet and exercise.  Immunizations reviewed.  Discussed safety.  Anemia, unspecified type - Plan: CBC with Differential/Platelet, Iron, TIBC and Ferritin Panel -Continue iron therapy.  Follow-up pending lab results  Need for hepatitis C screening test - Plan: Hepatitis C antibody -Done per screening guidelines  Screening for thyroid disorder - Plan: TSH, T4,  free  Screening for lipid disorders - Plan: Lipid panel

## 2020-11-23 NOTE — Patient Instructions (Signed)
Preventive Care 21-33 Years Old, Female Preventive care refers to visits with your health care provider and lifestyle choices that can promote health and wellness. This includes:  A yearly physical exam. This may also be called an annual well check.  Regular dental visits and eye exams.  Immunizations.  Screening for certain conditions.  Healthy lifestyle choices, such as eating a healthy diet, getting regular exercise, not using drugs or products that contain nicotine and tobacco, and limiting alcohol use. What can I expect for my preventive care visit? Physical exam Your health care provider will check your:  Height and weight. This may be used to calculate body mass index (BMI), which tells if you are at a healthy weight.  Heart rate and blood pressure.  Skin for abnormal spots. Counseling Your health care provider may ask you questions about your:  Alcohol, tobacco, and drug use.  Emotional well-being.  Home and relationship well-being.  Sexual activity.  Eating habits.  Work and work environment.  Method of birth control.  Menstrual cycle.  Pregnancy history. What immunizations do I need?  Influenza (flu) vaccine  This is recommended every year. Tetanus, diphtheria, and pertussis (Tdap) vaccine  You may need a Td booster every 10 years. Varicella (chickenpox) vaccine  You may need this if you have not been vaccinated. Human papillomavirus (HPV) vaccine  If recommended by your health care provider, you may need three doses over 6 months. Measles, mumps, and rubella (MMR) vaccine  You may need at least one dose of MMR. You may also need a second dose. Meningococcal conjugate (MenACWY) vaccine  One dose is recommended if you are age 19-21 years and a first-year college student living in a residence hall, or if you have one of several medical conditions. You may also need additional booster doses. Pneumococcal conjugate (PCV13) vaccine  You may need  this if you have certain conditions and were not previously vaccinated. Pneumococcal polysaccharide (PPSV23) vaccine  You may need one or two doses if you smoke cigarettes or if you have certain conditions. Hepatitis A vaccine  You may need this if you have certain conditions or if you travel or work in places where you may be exposed to hepatitis A. Hepatitis B vaccine  You may need this if you have certain conditions or if you travel or work in places where you may be exposed to hepatitis B. Haemophilus influenzae type b (Hib) vaccine  You may need this if you have certain conditions. You may receive vaccines as individual doses or as more than one vaccine together in one shot (combination vaccines). Talk with your health care provider about the risks and benefits of combination vaccines. What tests do I need?  Blood tests  Lipid and cholesterol levels. These may be checked every 5 years starting at age 20.  Hepatitis C test.  Hepatitis B test. Screening  Diabetes screening. This is done by checking your blood sugar (glucose) after you have not eaten for a while (fasting).  Sexually transmitted disease (STD) testing.  BRCA-related cancer screening. This may be done if you have a family history of breast, ovarian, tubal, or peritoneal cancers.  Pelvic exam and Pap test. This may be done every 3 years starting at age 21. Starting at age 30, this may be done every 5 years if you have a Pap test in combination with an HPV test. Talk with your health care provider about your test results, treatment options, and if necessary, the need for more tests.   Follow these instructions at home: Eating and drinking   Eat a diet that includes fresh fruits and vegetables, whole grains, lean protein, and low-fat dairy.  Take vitamin and mineral supplements as recommended by your health care provider.  Do not drink alcohol if: ? Your health care provider tells you not to drink. ? You are  pregnant, may be pregnant, or are planning to become pregnant.  If you drink alcohol: ? Limit how much you have to 0-1 drink a day. ? Be aware of how much alcohol is in your drink. In the [place] one drink equals one 12 oz bottle of beer (355 mL), one 5 oz glass of wine (148 mL), or one 1 oz glass of hard liquor (44 mL). Lifestyle  Take daily care of your teeth and gums.  Stay active. Exercise for at least 30 minutes on 5 or more days each week.  Do not use any products that contain nicotine or tobacco, such as cigarettes, e-cigarettes, and chewing tobacco. If you need help quitting, ask your health care provider.  If you are sexually active, practice safe sex. Use a condom or other form of birth control (contraception) in order to prevent pregnancy and STIs (sexually transmitted infections). If you plan to become pregnant, see your health care provider for a preconception visit. What's next?  Visit your health care provider once a year for a well check visit.  Ask your health care provider how often you should have your eyes and teeth checked.  Stay up to date on all vaccines. This information is not intended to replace advice given to you by your health care provider. Make sure you discuss any questions you have with your health care provider. Document Revised: 07/16/2018 Document Reviewed: 07/16/2018 Elsevier Patient Education  2020 [employer].

## 2020-11-24 ENCOUNTER — Encounter (HOSPITAL_COMMUNITY): Payer: Self-pay

## 2020-11-24 LAB — IRON,TIBC AND FERRITIN PANEL
Ferritin: 69 ng/mL (ref 15–150)
Iron Saturation: 26 % (ref 15–55)
Iron: 86 ug/dL (ref 27–159)
Total Iron Binding Capacity: 327 ug/dL (ref 250–450)
UIBC: 241 ug/dL (ref 131–425)

## 2020-11-24 LAB — CBC WITH DIFFERENTIAL/PLATELET
Basophils Absolute: 0 10*3/uL (ref 0.0–0.2)
Basos: 1 %
EOS (ABSOLUTE): 0.1 10*3/uL (ref 0.0–0.4)
Eos: 3 %
Hematocrit: 33.9 % — ABNORMAL LOW (ref 34.0–46.6)
Hemoglobin: 11.8 g/dL (ref 11.1–15.9)
Immature Grans (Abs): 0 10*3/uL (ref 0.0–0.1)
Immature Granulocytes: 0 %
Lymphocytes Absolute: 2.4 10*3/uL (ref 0.7–3.1)
Lymphs: 50 %
MCH: 30.4 pg (ref 26.6–33.0)
MCHC: 34.8 g/dL (ref 31.5–35.7)
MCV: 87 fL (ref 79–97)
Monocytes Absolute: 0.3 10*3/uL (ref 0.1–0.9)
Monocytes: 6 %
Neutrophils Absolute: 1.9 10*3/uL (ref 1.4–7.0)
Neutrophils: 40 %
Platelets: 256 10*3/uL (ref 150–450)
RBC: 3.88 x10E6/uL (ref 3.77–5.28)
RDW: 11.8 % (ref 11.7–15.4)
WBC: 4.7 10*3/uL (ref 3.4–10.8)

## 2020-11-24 LAB — LIPID PANEL
Chol/HDL Ratio: 2.4 ratio (ref 0.0–4.4)
Cholesterol, Total: 126 mg/dL (ref 100–199)
HDL: 52 mg/dL (ref >39)
LDL Chol Calc (NIH): 63 mg/dL (ref 0–99)
Triglycerides: 44 mg/dL (ref 0–149)
VLDL Cholesterol Cal: 11 mg/dL (ref 5–40)

## 2020-11-24 LAB — COMPREHENSIVE METABOLIC PANEL
ALT: 14 IU/L (ref 0–32)
AST: 18 IU/L (ref 0–40)
Albumin/Globulin Ratio: 1.7 (ref 1.2–2.2)
Albumin: 4.7 g/dL (ref 3.8–4.8)
Alkaline Phosphatase: 59 IU/L (ref 44–121)
BUN/Creatinine Ratio: 17 (ref 9–23)
BUN: 13 mg/dL (ref 6–20)
Bilirubin Total: 0.3 mg/dL (ref 0.0–1.2)
CO2: 26 mmol/L (ref 20–29)
Calcium: 9.7 mg/dL (ref 8.7–10.2)
Chloride: 102 mmol/L (ref 96–106)
Creatinine, Ser: 0.77 mg/dL (ref 0.57–1.00)
GFR calc Af Amer: 118 mL/min/{1.73_m2} (ref >59)
GFR calc non Af Amer: 102 mL/min/{1.73_m2} (ref >59)
Globulin, Total: 2.7 g/dL (ref 1.5–4.5)
Glucose: 107 mg/dL — ABNORMAL HIGH (ref 65–99)
Potassium: 3.8 mmol/L (ref 3.5–5.2)
Sodium: 140 mmol/L (ref 134–144)
Total Protein: 7.4 g/dL (ref 6.0–8.5)

## 2020-11-24 LAB — HEPATITIS C ANTIBODY: Hep C Virus Ab: <0.1 s/co ratio (ref 0.0–0.9)

## 2020-11-24 LAB — T4, FREE: Free T4: 1.41 ng/dL (ref 0.82–1.77)

## 2020-11-24 LAB — TSH: TSH: 0.546 u[IU]/mL (ref 0.450–4.500)

## 2020-11-24 NOTE — Progress Notes (Signed)
Please let her know that her labs overall look good.  Her blood sugar was slightly elevated at 107.  Was she fasting or had she eaten within 6 hours of her visit?  If she had eaten or drank anything other than water then this result is not concerning at all.  If she was fasting then we should see if we can add a hemoglobin A1c.  No sign of anemia.  Her cholesterol looks good.  Negative for hepatitis C.

## 2020-11-28 LAB — CYTOLOGY - PAP
Chlamydia: NEGATIVE
Comment: NEGATIVE
Comment: NEGATIVE
Comment: NORMAL
Diagnosis: NEGATIVE
High risk HPV: NEGATIVE
Neisseria Gonorrhea: NEGATIVE

## 2020-12-19 DIAGNOSIS — Z419 Encounter for procedure for purposes other than remedying health state, unspecified: Secondary | ICD-10-CM | POA: Diagnosis not present

## 2021-01-16 DIAGNOSIS — Z419 Encounter for procedure for purposes other than remedying health state, unspecified: Secondary | ICD-10-CM | POA: Diagnosis not present

## 2021-02-16 DIAGNOSIS — Z419 Encounter for procedure for purposes other than remedying health state, unspecified: Secondary | ICD-10-CM | POA: Diagnosis not present

## 2021-03-18 DIAGNOSIS — Z419 Encounter for procedure for purposes other than remedying health state, unspecified: Secondary | ICD-10-CM | POA: Diagnosis not present

## 2021-04-18 DIAGNOSIS — Z419 Encounter for procedure for purposes other than remedying health state, unspecified: Secondary | ICD-10-CM | POA: Diagnosis not present

## 2021-05-18 DIAGNOSIS — Z419 Encounter for procedure for purposes other than remedying health state, unspecified: Secondary | ICD-10-CM | POA: Diagnosis not present

## 2021-10-26 ENCOUNTER — Telehealth: Payer: Self-pay | Admitting: Family Medicine

## 2021-10-26 NOTE — Telephone Encounter (Signed)
Dismissal letter in  snapshot

## 2021-11-18 NOTE — L&D Delivery Note (Addendum)
OB[facility]N Faculty Practice Delivery Note  Christine Taylor is a 36 y.o. [id number] s/p SOL at [redacted]w[redacted]d She was admitted for SOL.   ROM: 3h 527mith clear fluid GBS Status:  Positive/-- (09/21 0000) Maximum Maternal Temperature: 98  Labor Progress: Initial SVE: 6/60/-2. She then progressed to complete.   Delivery Date/Time: 08/21/22 @ 0157 Delivery: Called to room and patient was complete and pushing. Head delivered . Loose nuchal cord present. Shoulder and body delivered in usual fashion. Infant with spontaneous cry, placed on mother's abdomen, dried and stimulated. Cord clamped x 2 after 1-minute delay, and cut by FOB. Cord blood drawn. Placenta delivered spontaneously with gentle cord traction. Fundus firm with massage and Pitocin. Labia, perineum, vagina, and cervix inspected with no lacerations.  Baby Weight: pending  Placenta: 3 vessel, intact. Sent to L&D Complications: None Lacerations: No lacerations EBL: 53 mL Analgesia: Natural   Infant:  APGAR (1 MIN):  8 APGAR (5 MINS):  9 [place] OB Family Medicine Fellow, FaLake[facility] 08/21/2022, 2:11 AM  GME ATTESTATION:  I saw and evaluated the patient. I agree with the findings and the plan of care as documented in the resident's note. I have made changes to documentation as necessary.  SiGerlene FeeDO OB Fellow, FaOn[facility]0/02/2022, 2:20 AM

## 2022-02-12 ENCOUNTER — Ambulatory Visit: Payer: 59

## 2022-05-13 ENCOUNTER — Telehealth: Payer: Self-pay

## 2022-06-24 ENCOUNTER — Other Ambulatory Visit: Payer: Medicaid Other

## 2022-06-24 ENCOUNTER — Encounter: Payer: Medicaid Other | Admitting: Obstetrics and Gynecology

## 2022-07-12 ENCOUNTER — Encounter: Payer: Self-pay | Admitting: *Deleted

## 2022-07-17 ENCOUNTER — Other Ambulatory Visit: Payer: Self-pay | Admitting: Obstetrics and Gynecology

## 2022-07-17 DIAGNOSIS — Z363 Encounter for antenatal screening for malformations: Secondary | ICD-10-CM

## 2022-07-17 DIAGNOSIS — Z3A33 33 weeks gestation of pregnancy: Secondary | ICD-10-CM

## 2022-07-18 ENCOUNTER — Ambulatory Visit: Payer: Medicaid Other | Attending: Obstetrics and Gynecology

## 2022-07-18 ENCOUNTER — Ambulatory Visit: Payer: Medicaid Other | Admitting: *Deleted

## 2022-07-18 ENCOUNTER — Encounter: Payer: Self-pay | Admitting: *Deleted

## 2022-07-18 VITALS — BP 119/67 | HR 80

## 2022-07-18 DIAGNOSIS — Z3689 Encounter for other specified antenatal screening: Secondary | ICD-10-CM

## 2022-07-18 DIAGNOSIS — Z363 Encounter for antenatal screening for malformations: Secondary | ICD-10-CM | POA: Insufficient documentation

## 2022-07-18 DIAGNOSIS — Z3A33 33 weeks gestation of pregnancy: Secondary | ICD-10-CM | POA: Insufficient documentation

## 2022-07-23 ENCOUNTER — Other Ambulatory Visit: Payer: Medicaid Other

## 2022-07-23 ENCOUNTER — Encounter: Payer: Medicaid Other | Admitting: Obstetrics and Gynecology

## 2022-08-08 LAB — OB RESULTS CONSOLE GBS: GBS: POSITIVE

## 2022-08-15 ENCOUNTER — Other Ambulatory Visit (HOSPITAL_COMMUNITY): Payer: Self-pay | Admitting: *Deleted

## 2022-08-16 ENCOUNTER — Encounter: Payer: Medicaid Other | Admitting: Obstetrics and Gynecology

## 2022-08-16 ENCOUNTER — Other Ambulatory Visit: Payer: Medicaid Other

## 2022-08-19 ENCOUNTER — Encounter (HOSPITAL_COMMUNITY)
Admission: RE | Admit: 2022-08-19 | Discharge: 2022-08-19 | Disposition: A | Discharge status: Home / Self Care | Payer: Commercial Managed Care - HMO | Source: Ambulatory Visit | Attending: Obstetrics and Gynecology | Admitting: Obstetrics and Gynecology

## 2022-08-19 DIAGNOSIS — O99019 Anemia complicating pregnancy, unspecified trimester: Secondary | ICD-10-CM | POA: Diagnosis present

## 2022-08-19 MED ORDER — IRON SUCROSE 20 MG/ML IV SOLN
500.0000 mg | INTRAVENOUS | Status: DC
  Administered 2022-08-19: 500 mg via INTRAVENOUS
  Filled 2022-08-19: qty 500

## 2022-08-20 ENCOUNTER — Encounter (HOSPITAL_COMMUNITY): Payer: Self-pay | Admitting: Obstetrics and Gynecology

## 2022-08-20 ENCOUNTER — Inpatient Hospital Stay (HOSPITAL_COMMUNITY)
Admission: AD | Admit: 2022-08-20 | Discharge: 2022-08-23 | DRG: 807 | Disposition: A | Discharge status: Home / Self Care | Payer: Commercial Managed Care - HMO | Attending: Obstetrics and Gynecology | Admitting: Obstetrics and Gynecology

## 2022-08-20 DIAGNOSIS — O4202 Full-term premature rupture of membranes, onset of labor within 24 hours of rupture: Secondary | ICD-10-CM | POA: Diagnosis not present

## 2022-08-20 DIAGNOSIS — Z3A37 37 weeks gestation of pregnancy: Secondary | ICD-10-CM | POA: Diagnosis not present

## 2022-08-20 DIAGNOSIS — O9902 Anemia complicating childbirth: Secondary | ICD-10-CM | POA: Diagnosis present

## 2022-08-20 DIAGNOSIS — Z23 Encounter for immunization: Secondary | ICD-10-CM

## 2022-08-20 DIAGNOSIS — D573 Sickle-cell trait: Secondary | ICD-10-CM | POA: Diagnosis present

## 2022-08-20 DIAGNOSIS — O99824 Streptococcus B carrier state complicating childbirth: Secondary | ICD-10-CM | POA: Diagnosis present

## 2022-08-20 DIAGNOSIS — O479 False labor, unspecified: Secondary | ICD-10-CM | POA: Diagnosis present

## 2022-08-20 MED ORDER — LACTATED RINGERS IV SOLN: INTRAVENOUS | Status: DC

## 2022-08-20 NOTE — MAU Provider Note (Signed)
Pt informed that the ultrasound is considered a limited OB ultrasound and is not intended to be a complete ultrasound exam.  Patient also informed that the ultrasound is not being completed with the intent of assessing for fetal or placental anomalies or any pelvic abnormalities.  Explained that the purpose of today's ultrasound is to assess for presentation.  .  Patient acknowledges the purpose of the exam and the limitations of the study.   ' Longitudinal Lie Vertex Presentation

## 2022-08-20 NOTE — H&P (Shared)
OBSTETRIC ADMISSION HISTORY AND PHYSICAL  Christine Taylor is a 36 y.o. female [phone] with IUP at 58w5dpresenting for SOL. She reports +FMs. No LOF, VB, blurry vision, headaches, peripheral edema, or RUQ pain. She plans on BREASTfeeding. She requests undecided for birth control.  Dating: By *** --->  Estimated Date of Delivery: 09/05/22  Sono:    @***w***d, normal anatomy, *** presentation, ***g, ***%ile, EFW ***   Prenatal History/Complications: ***  Past Medical History: Past Medical History:  Diagnosis Date   Anemia    Asthma    as chlid   Fibroid    Hx of varicella    Late prenatal care    Urinary tract infection     Past Surgical History: Past Surgical History:  Procedure Laterality Date   NO PAST SURGERIES      Obstetrical History: OB History     Gravida  5   Para  3   Term  3   Preterm  0   AB  1   Living  3      SAB  1   IAB  0   Ectopic  0   Multiple      Live Births  3           Social History: Social History   Socioeconomic History   Marital status: Married    Spouse name: Not on file   Number of children: Not on file   Years of education: Not on file   Highest education level: Not on file  Occupational History   Not on file  Tobacco Use   Smoking status: Never   Smokeless tobacco: Never  Vaping Use   Vaping Use: Never used  Substance and Sexual Activity   Alcohol use: Not Currently   Drug use: Never   Sexual activity: Not Currently    Comment: Removing 11/22/2020  Other Topics Concern   Not on file  Social History Narrative   ** Merged History Encounter **       Social Determinants of Health   Financial Resource Strain: Not on file  Food Insecurity: Not on file  Transportation Needs: Not on file  Physical Activity: Not on file  Stress: Not on file  Social Connections: Not on file    Family History: Family History  Problem Relation Age of Onset   Hypertension Father    Diabetes Mother     Anesthesia problems Neg Hx     Allergies: No Known Allergies  Medications Prior to Admission  Medication Sig Dispense Refill Last Dose   ferrous sulfate 325 (65 FE) MG tablet Take 325 mg by mouth daily.   08/20/2022   Multiple Vitamin (MULTIVITAMIN) tablet Take 1 tablet by mouth daily.   08/20/2022   ferrous sulfate 325 (65 FE) MG tablet Take 325 mg by mouth daily with breakfast.        Review of Systems:  All systems reviewed and negative except as stated in HPI  PE: Blood pressure 112/69, pulse 75, temperature 98 F (36.7 C), resp. rate 17, height '5\' 2"'$  (1.575 m), weight 79.8 kg, last menstrual period 11/29/2021, SpO2 100 %, unknown if currently breastfeeding. General appearance: alert and cooperative Lungs: regular rate and effort Heart: regular rate  Abdomen: soft, non-tender Extremities: Homans sign is negative, no sign of DVT Presentation: cephalic EFM: *** bpm, *** variability, *** accels, *** decels Toco: ***   SVE: ***  Prenatal labs: ABO, Rh:   Antibody:   Rubella:  RPR:    HBsAg:    HIV:    GBS: Positive/-- (09/21 0000)  2 hr GTT ***  Prenatal Transfer Tool  Maternal Diabetes: {Maternal Diabetes:3043596} Genetic Screening: {Genetic Screening:20205} Maternal Ultrasounds/Referrals: {Maternal Ultrasounds / Referrals:20211} Fetal Ultrasounds or other Referrals:  {Fetal Ultrasounds or Other Referrals:20213} Maternal Substance Abuse:  {Maternal Substance Abuse:20223} Significant Maternal Medications:  {Significant Maternal Meds:20233} Significant Maternal Lab Results: {Significant Maternal Lab Results:20235}  No results found for this or any previous visit (from the past 24 hour(s)).  Patient Active Problem List   Diagnosis Date Noted   Variable fetal heart rate decelerations, antepartum 11/11/2013   SAB (spontaneous abortion) 04/15/2012   OTITIS MEDIA, LEFT 11/14/2008   GLYCOSURIA 11/14/2008   PRURITUS 08/04/2008   ANEMIA 07/01/2008   ALLERGIC  RHINITIS 06/30/2008   ASTHMA, CHILDHOOD 06/30/2008   GERD 06/30/2008   HEAT RASH 06/30/2008   FATIGUE 06/30/2008    Assessment: Christine Taylor is a 36 y.o. [id number] at 60w5dhere for SOL  1. Labor: Progressing well. Initial cervical check for multiparrous promising for rapid labor. 2. FWB:  CAT 1 3. Pain: ***    4. GBS: Positive   Plan: Admit to L&D  CDeloria Lair DO  08/20/2022, 11:45 PM

## 2022-08-20 NOTE — MAU Note (Signed)
.  Christine Taylor is a 36 y.o. at 71w5dhere in MAU reporting leaking clear fld since 2200. Mild cramping and reports good FM. Denies VB  Onset of complaint: 2200 Pain score: 1 Vitals:   08/20/22 2249 08/20/22 2251  BP:  112/69  Pulse: 75   Resp: 17   Temp: 98 F (36.7 C)   SpO2: 100%      FHT:135 Lab orders placed from triage:  mau labor eval

## 2022-08-21 ENCOUNTER — Encounter (HOSPITAL_COMMUNITY): Payer: Self-pay | Admitting: Obstetrics and Gynecology

## 2022-08-21 ENCOUNTER — Other Ambulatory Visit: Payer: Self-pay

## 2022-08-21 DIAGNOSIS — O26893 Other specified pregnancy related conditions, third trimester: Secondary | ICD-10-CM | POA: Diagnosis present

## 2022-08-21 DIAGNOSIS — O99824 Streptococcus B carrier state complicating childbirth: Secondary | ICD-10-CM | POA: Diagnosis present

## 2022-08-21 DIAGNOSIS — Z23 Encounter for immunization: Secondary | ICD-10-CM | POA: Diagnosis not present

## 2022-08-21 DIAGNOSIS — O9902 Anemia complicating childbirth: Secondary | ICD-10-CM | POA: Diagnosis present

## 2022-08-21 DIAGNOSIS — O99820 Streptococcus B carrier state complicating pregnancy: Secondary | ICD-10-CM | POA: Diagnosis not present

## 2022-08-21 DIAGNOSIS — O479 False labor, unspecified: Principal | ICD-10-CM | POA: Diagnosis present

## 2022-08-21 DIAGNOSIS — Z3A37 37 weeks gestation of pregnancy: Secondary | ICD-10-CM

## 2022-08-21 DIAGNOSIS — D573 Sickle-cell trait: Secondary | ICD-10-CM | POA: Diagnosis present

## 2022-08-21 LAB — CBC
HCT: 27.6 % — ABNORMAL LOW (ref 36.0–46.0)
HCT: 27.8 % — ABNORMAL LOW (ref 36.0–46.0)
Hemoglobin: 9.9 g/dL — ABNORMAL LOW (ref 12.0–15.0)
Hemoglobin: 9.9 g/dL — ABNORMAL LOW (ref 12.0–15.0)
MCH: 32.2 pg (ref 26.0–34.0)
MCH: 32.2 pg (ref 26.0–34.0)
MCHC: 35.6 g/dL (ref 30.0–36.0)
MCHC: 35.9 g/dL (ref 30.0–36.0)
MCV: 89.9 fL (ref 80.0–100.0)
MCV: 90.6 fL (ref 80.0–100.0)
Platelets: 266 10*3/uL (ref 150–400)
Platelets: 271 10*3/uL (ref 150–400)
RBC: 3.07 MIL/uL — ABNORMAL LOW (ref 3.87–5.11)
RBC: 3.07 MIL/uL — ABNORMAL LOW (ref 3.87–5.11)
RDW: 13.2 % (ref 11.5–15.5)
RDW: 13.2 % (ref 11.5–15.5)
WBC: 12.3 10*3/uL — ABNORMAL HIGH (ref 4.0–10.5)
WBC: 7.9 10*3/uL (ref 4.0–10.5)
nRBC: 0 % (ref 0.0–0.2)
nRBC: 0 % (ref 0.0–0.2)

## 2022-08-21 LAB — TYPE AND SCREEN
ABO/RH(D): O POS
Antibody Screen: NEGATIVE

## 2022-08-21 LAB — POCT FERN TEST: POCT Fern Test: POSITIVE

## 2022-08-21 LAB — RPR: RPR Ser Ql: NONREACTIVE

## 2022-08-21 MED ORDER — ACETAMINOPHEN 325 MG PO TABS: 650.0000 mg | ORAL_TABLET | ORAL | Status: DC | PRN

## 2022-08-21 MED ORDER — OXYCODONE-ACETAMINOPHEN 5-325 MG PO TABS: 1.0000 | ORAL_TABLET | ORAL | Status: DC | PRN

## 2022-08-21 MED ORDER — INFLUENZA VAC SPLIT QUAD 0.5 ML IM SUSY
0.5000 mL | PREFILLED_SYRINGE | Freq: Once | INTRAMUSCULAR | Status: AC
  Administered 2022-08-21: 0.5 mL via INTRAMUSCULAR

## 2022-08-21 MED ORDER — ONDANSETRON HCL 4 MG/2ML IJ SOLN: 4.0000 mg | Freq: Four times a day (QID) | INTRAMUSCULAR | Status: DC | PRN

## 2022-08-21 MED ORDER — ONDANSETRON HCL 4 MG/2ML IJ SOLN: 4.0000 mg | INTRAMUSCULAR | Status: DC | PRN

## 2022-08-21 MED ORDER — OXYTOCIN-SODIUM CHLORIDE 30-0.9 UT/500ML-% IV SOLN
2.5000 [IU]/h | INTRAVENOUS | Status: DC
  Administered 2022-08-21: 2.5 [IU]/h via INTRAVENOUS
  Filled 2022-08-21: qty 500

## 2022-08-21 MED ORDER — INFLUENZA VAC SPLIT QUAD 0.5 ML IM SUSY: 0.5000 mL | PREFILLED_SYRINGE | INTRAMUSCULAR | Status: DC

## 2022-08-21 MED ORDER — SODIUM CHLORIDE 0.9 % IV SOLN: 250.0000 mL | INTRAVENOUS | Status: DC | PRN

## 2022-08-21 MED ORDER — SODIUM CHLORIDE 0.9% FLUSH
3.0000 mL | Freq: Two times a day (BID) | INTRAVENOUS | Status: DC
  Administered 2022-08-21 (×2): 3 mL via INTRAVENOUS

## 2022-08-21 MED ORDER — OXYCODONE-ACETAMINOPHEN 5-325 MG PO TABS: 2.0000 | ORAL_TABLET | ORAL | Status: DC | PRN

## 2022-08-21 MED ORDER — SOD CITRATE-CITRIC ACID 500-334 MG/5ML PO SOLN: 30.0000 mL | ORAL | Status: DC | PRN

## 2022-08-21 MED ORDER — TETANUS-DIPHTH-ACELL PERTUSSIS 5-2.5-18.5 LF-MCG/0.5 IM SUSY: 0.5000 mL | PREFILLED_SYRINGE | Freq: Once | INTRAMUSCULAR | Status: DC

## 2022-08-21 MED ORDER — WITCH HAZEL-GLYCERIN EX PADS: 1.0000 | MEDICATED_PAD | CUTANEOUS | Status: DC | PRN

## 2022-08-21 MED ORDER — SODIUM CHLORIDE 0.9% FLUSH: 3.0000 mL | INTRAVENOUS | Status: DC | PRN

## 2022-08-21 MED ORDER — ONDANSETRON HCL 4 MG PO TABS: 4.0000 mg | ORAL_TABLET | ORAL | Status: DC | PRN

## 2022-08-21 MED ORDER — FENTANYL CITRATE (PF) 100 MCG/2ML IJ SOLN: 50.0000 ug | INTRAMUSCULAR | Status: DC | PRN

## 2022-08-21 MED ORDER — COCONUT OIL OIL: 1.0000 | TOPICAL_OIL | Status: DC | PRN

## 2022-08-21 MED ORDER — PENICILLIN G POT IN DEXTROSE 60000 UNIT/ML IV SOLN: 3.0000 10*6.[IU] | INTRAVENOUS | Status: DC

## 2022-08-21 MED ORDER — IBUPROFEN 600 MG PO TABS
600.0000 mg | ORAL_TABLET | Freq: Four times a day (QID) | ORAL | Status: DC
  Administered 2022-08-21 – 2022-08-23 (×9): 600 mg via ORAL
  Filled 2022-08-21 (×9): qty 1

## 2022-08-21 MED ORDER — PRENATAL MULTIVITAMIN CH
1.0000 | ORAL_TABLET | Freq: Every day | ORAL | Status: DC
  Administered 2022-08-23: 1 via ORAL
  Filled 2022-08-21 (×2): qty 1

## 2022-08-21 MED ORDER — SIMETHICONE 80 MG PO CHEW: 80.0000 mg | CHEWABLE_TABLET | ORAL | Status: DC | PRN

## 2022-08-21 MED ORDER — IBUPROFEN 800 MG PO TABS
800.0000 mg | ORAL_TABLET | Freq: Once | ORAL | Status: AC
  Administered 2022-08-21: 800 mg via ORAL
  Filled 2022-08-21: qty 1

## 2022-08-21 MED ORDER — LACTATED RINGERS IV SOLN: 500.0000 mL | INTRAVENOUS | Status: DC | PRN

## 2022-08-21 MED ORDER — DIBUCAINE (PERIANAL) 1 % EX OINT: 1.0000 | TOPICAL_OINTMENT | CUTANEOUS | Status: DC | PRN

## 2022-08-21 MED ORDER — MEASLES, MUMPS & RUBELLA VAC IJ SOLR: 0.5000 mL | Freq: Once | INTRAMUSCULAR | Status: DC

## 2022-08-21 MED ORDER — OXYCODONE HCL 5 MG PO TABS
5.0000 mg | ORAL_TABLET | ORAL | Status: AC | PRN
  Administered 2022-08-21: 5 mg via ORAL
  Filled 2022-08-21: qty 1

## 2022-08-21 MED ORDER — BENZOCAINE-MENTHOL 20-0.5 % EX AERO: 1.0000 | INHALATION_SPRAY | CUTANEOUS | Status: DC | PRN

## 2022-08-21 MED ORDER — SODIUM CHLORIDE 0.9 % IV SOLN
5.0000 10*6.[IU] | Freq: Once | INTRAVENOUS | Status: AC
  Administered 2022-08-21: 5 10*6.[IU] via INTRAVENOUS

## 2022-08-21 MED ORDER — OXYTOCIN BOLUS FROM INFUSION
333.0000 mL | Freq: Once | INTRAVENOUS | Status: AC
  Administered 2022-08-21: 333 mL via INTRAVENOUS

## 2022-08-21 MED ORDER — SENNOSIDES-DOCUSATE SODIUM 8.6-50 MG PO TABS
2.0000 | ORAL_TABLET | ORAL | Status: DC
  Administered 2022-08-21 – 2022-08-23 (×3): 2 via ORAL
  Filled 2022-08-21 (×3): qty 2

## 2022-08-21 MED ORDER — DIPHENHYDRAMINE HCL 25 MG PO CAPS: 25.0000 mg | ORAL_CAPSULE | Freq: Four times a day (QID) | ORAL | Status: DC | PRN

## 2022-08-21 MED ORDER — ZOLPIDEM TARTRATE 5 MG PO TABS: 5.0000 mg | ORAL_TABLET | Freq: Every evening | ORAL | Status: DC | PRN

## 2022-08-21 MED ORDER — LIDOCAINE HCL (PF) 1 % IJ SOLN: 30.0000 mL | INTRAMUSCULAR | Status: DC | PRN

## 2022-08-21 NOTE — Discharge Instructions (Signed)
[facility] DEPT. AT [phone] to schedule your postpartum visit.

## 2022-08-21 NOTE — Discharge Summary (Addendum)
Postpartum Discharge Summary  Date of Service updated10/6/23     Patient Name: Christine Taylor DOB: 1986-02-13 MRN: [id number]  Date of admission: 08/20/2022 Delivery date:08/21/2022  Delivering provider: Deloria Lair  Date of discharge: 08/23/2022  Admitting diagnosis: Uterine contractions at greater than 20 weeks of gestation [O47.9] Intrauterine pregnancy: [redacted]w[redacted]d    Secondary diagnosis:  Principal Problem:   Vaginal delivery Active Problems:   Uterine contractions at greater than 20 weeks of gestation  Additional problems: NA    Discharge diagnosis: Term Pregnancy Delivered and Anemia                                              Post partum procedures:postpartum tubal ligation Augmentation: N/A Complications: None  Hospital course: Onset of Labor With Vaginal Delivery      36y.o. yo [id number] 381w6das admitted in Active Labor on 08/20/2022. Patient had an uncomplicated labor course as follows:  Membrane Rupture Time/Date: 10:00 PM ,08/20/2022   Delivery Method:Vaginal, Spontaneous  Episiotomy: None  Lacerations:  None  Patient had an uncomplicated postpartum course.  She is ambulating, tolerating a regular diet, passing flatus, and urinating well. Patient is discharged home in stable condition on 08/23/22.  Newborn Data: Birth date:08/21/2022  Birth time:1:57 AM  Gender:Female  Living status:Living  Apgars:8 ,9  Weight:3270 g   Magnesium Sulfate received: No BMZ received: No Rhophylac:No MMR:No T-DaP:Given prenatally Flu: N/A Transfusion:No  Physical exam  Vitals:   08/22/22 0930 08/22/22 2115 08/23/22 0645 08/23/22 0939  BP: 116/77 117/65 118/76 130/81  Pulse: 73 83 66 85  Resp: 18 18 18 18   Temp: 98.9 F (37.2 C) 98.6 F (37 C) 98.2 F (36.8 C) 97.7 F (36.5 C)  TempSrc: Oral Oral Oral Oral  SpO2: 99% 100% 100% 100%  Weight:      Height:       General: alert Lochia: appropriate Uterine Fundus: firm Incision: Healing well with no  significant drainage DVT Evaluation: No evidence of DVT seen on physical exam. Labs: Lab Results  Component Value Date   WBC 12.3 (H) 08/21/2022   HGB 9.9 (L) 08/21/2022   HCT 27.6 (L) 08/21/2022   MCV 89.9 08/21/2022   PLT 266 08/21/2022      Latest Ref Rng & Units 11/23/2020    2:44 PM  CMP  Glucose 65 - 99 mg/dL 107   BUN 6 - 20 mg/dL 13   Creatinine 0.57 - 1.00 mg/dL 0.77   Sodium 134 - 144 mmol/L 140   Potassium 3.5 - 5.2 mmol/L 3.8   Chloride 96 - 106 mmol/L 102   CO2 20 - 29 mmol/L 26   Calcium 8.7 - 10.2 mg/dL 9.7   Total Protein 6.0 - 8.5 g/dL 7.4   Total Bilirubin 0.0 - 1.2 mg/dL 0.3   Alkaline Phos 44 - 121 IU/L 59   AST 0 - 40 IU/L 18   ALT 0 - 32 IU/L 14    Edinburgh Score:    08/21/2022    5:12 PM  Edinburgh Postnatal Depression Scale Screening Tool  I have been able to laugh and see the funny side of things. 0  I have looked forward with enjoyment to things. 0  I have blamed myself unnecessarily when things went wrong. 1  I have been anxious or worried for no good reason. 1  I have felt scared or panicky for no good reason. 1  Things have been getting on top of me. 1  I have been so unhappy that I have had difficulty sleeping. 0  I have felt sad or miserable. 1  I have been so unhappy that I have been crying. 1  The thought of harming myself has occurred to me. 0  Edinburgh Postnatal Depression Scale Total 6     After visit meds:  Allergies as of 08/23/2022   No Known Allergies      Medication List     TAKE these medications    acetaminophen 325 MG tablet Commonly known as: Tylenol Take 2 tablets (650 mg total) by mouth every 4 (four) hours as needed (for pain scale < 4).   benzocaine-Menthol 20-0.5 % Aero Commonly known as: DERMOPLAST Apply 1 Application topically as needed for irritation (perineal discomfort).   ferrous sulfate 325 (65 FE) MG tablet Take 325 mg by mouth daily.   ferrous sulfate 325 (65 FE) MG tablet Take 325 mg by  mouth daily with breakfast.   ibuprofen 600 MG tablet Commonly known as: ADVIL Take 1 tablet (600 mg total) by mouth every 6 (six) hours.   multivitamin tablet Take 1 tablet by mouth daily.   oxyCODONE 5 MG immediate release tablet Commonly known as: Oxy IR/ROXICODONE Take 1 tablet (5 mg total) by mouth every 6 (six) hours as needed for up to 5 days for severe pain.   senna-docusate 8.6-50 MG tablet Commonly known as: Senokot-S Take 2 tablets by mouth daily. Start taking on: August 24, 2022         Discharge home in stable condition Infant Feeding: Breast Infant Disposition:home with mother Discharge instruction: per After Visit Summary and Postpartum booklet. Activity: Advance as tolerated. Pelvic rest for 6 weeks.  Diet: routine diet Future Appointments: Future Appointments  Date Time Provider [place]  08/26/2022  8:00 AM [facility] None  09/02/2022  2:30 PM Darliss Cheney, MD [place] None   Follow up Visit: @[facility]  - Message sent to [facility] for preop visit and Interval tubal/consent. 08/23/22 Ernestine Conrad Mercado-Ortiz    08/23/2022 Shelda Pal, DO

## 2022-08-21 NOTE — Lactation Note (Signed)
This note was copied from a baby's chart. Lactation Consultation Note  Patient Name: Christine Taylor EXNTZ'G Date: 08/21/2022 Reason for consult: Initial assessment Age:34 hours  P4, Visitor in room.  Mother denies questions or concerns. Feed on demand with cues.  Goal 8-12+ times per day after first 24 hrs.  Place baby STS if not cueing.  Suggest calling for help as needed.    Maternal Data Does the patient have breastfeeding experience prior to this delivery?: Yes How long did the patient breastfeed?: 6-9 mos.  Feeding Mother's Current Feeding Choice: Breast Milk   Interventions Interventions: Print production planner   Consult Status Consult Status: Follow-up Date: 08/22/22 Follow-up type: In-patient    Vivianne Master [facility] 08/21/2022, 10:12 AM

## 2022-08-22 ENCOUNTER — Encounter (HOSPITAL_COMMUNITY): Payer: Self-pay | Admitting: Anesthesiology

## 2022-08-22 MED ORDER — FAMOTIDINE 20 MG PO TABS
40.0000 mg | ORAL_TABLET | Freq: Once | ORAL | Status: AC
  Administered 2022-08-23: 40 mg via ORAL
  Filled 2022-08-22: qty 2

## 2022-08-22 MED ORDER — METOCLOPRAMIDE HCL 10 MG PO TABS
10.0000 mg | ORAL_TABLET | Freq: Once | ORAL | Status: AC
  Administered 2022-08-23: 10 mg via ORAL
  Filled 2022-08-22: qty 1

## 2022-08-22 MED ORDER — LACTATED RINGERS IV SOLN: INTRAVENOUS | Status: DC

## 2022-08-22 NOTE — Progress Notes (Signed)
POSTPARTUM PROGRESS NOTE  Post Partum Day 1  Subjective:  Christine Taylor is a 36 y.o. [id number] s/p SVD at [redacted]w[redacted]d  She reports she is doing well. No acute events overnight. She denies any problems with ambulating, voiding or po intake. Denies nausea or vomiting.  Endorses passing flatus. Pain is well controlled.  Lochia is minimal, patient was concerned she was still passing small clots.  Objective: Blood pressure 118/82, pulse 68, temperature 98.5 F (36.9 C), temperature source Oral, resp. rate 18, height '5\' 2"'$  (1.575 m), weight 79.8 kg, last menstrual period 11/29/2021, SpO2 99 %, unknown if currently breastfeeding.  Physical Exam:  General: alert, cooperative and no distress, baby laying on chest  Chest: no respiratory distress Heart:regular rate, distal pulses intact Abdomen: soft, nontender,  Uterine Fundus: firm, appropriately tender DVT Evaluation: No calf swelling or tenderness Extremities: No edema Skin: warm, dry  Recent Labs    08/20/22 2356 08/21/22 0526  HGB 9.9* 9.9*  HCT 27.8* 27.6*    Assessment/Plan: Christine Taylor a 36y.o. [id number]/p SVD at 312w6d PPD#1 - Doing well  Routine postpartum care, reassured patient regarding normal lochia and vaginal bleeding pp.  Contraception: BTL, tomorrow s/t scheduling  Feeding: Breast, without difficulty  Dispo: Plan for discharge after BTL.    LOS: 1 day   AkLowry RamMD  PGY-1, [facility]  08/22/2022, 7:23 AM

## 2022-08-23 ENCOUNTER — Encounter (HOSPITAL_COMMUNITY)
Admission: AD | Disposition: A | Discharge status: Home / Self Care | Payer: Self-pay | Source: Home / Self Care | Attending: Obstetrics and Gynecology

## 2022-08-23 SURGERY — LIGATION, FALLOPIAN TUBE, POSTPARTUM
Anesthesia: Choice | Laterality: Bilateral

## 2022-08-23 MED ORDER — OXYCODONE HCL 5 MG PO TABS: 5.0000 mg | ORAL_TABLET | Freq: Four times a day (QID) | ORAL | 0 refills | Status: AC | PRN

## 2022-08-23 MED ORDER — ACETAMINOPHEN 325 MG PO TABS: 650.0000 mg | ORAL_TABLET | ORAL | 0 refills | Status: AC | PRN

## 2022-08-23 MED ORDER — IBUPROFEN 600 MG PO TABS: 600.0000 mg | ORAL_TABLET | Freq: Four times a day (QID) | ORAL | 0 refills | Status: AC

## 2022-08-23 MED ORDER — SENNOSIDES-DOCUSATE SODIUM 8.6-50 MG PO TABS: 2.0000 | ORAL_TABLET | ORAL | 0 refills | Status: AC

## 2022-08-23 MED ORDER — BENZOCAINE-MENTHOL 20-0.5 % EX AERO: 1.0000 | INHALATION_SPRAY | CUTANEOUS | 0 refills | Status: AC | PRN

## 2022-08-23 NOTE — Lactation Note (Signed)
This note was copied from a baby's chart. Lactation Consultation Note  Patient Name: Christine Taylor YSHUO'[id number] Date: 08/23/2022 Reason for consult: Follow-up assessment;Early term 37-38.6wks;Engorgement Age:34 hours Baby has been BF well and a lot. Mom is pumping and giving BM in a bottle after BF. Breast are engorged. Hard, tender, knots. Mom pumped and LC assisted in breast massage and mom pumped 1 oz from each breast. Suggested mom lay back and gave Ice to apply to breast rotating around for 20 minutes then pump again. Encouragement management reviewed. OP LC and support groups reviewed.  Maternal Data Has patient been taught Hand Expression?: Yes Does the patient have breastfeeding experience prior to this delivery?: Yes  Feeding Nipple Type: Nfant Slow Flow (purple)  LATCH Score Latch: Grasps breast easily, tongue down, lips flanged, rhythmical sucking.  Audible Swallowing: Spontaneous and intermittent  Type of Nipple: Everted at rest and after stimulation  Comfort (Breast/Nipple): Engorged, cracked, bleeding, large blisters, severe discomfort  Hold (Positioning): No assistance needed to correctly position infant at breast.  LATCH Score: 8   Lactation Tools Discussed/Used Tools: Pump Breast pump type: Double-Electric Breast Pump Pump Education: Setup, frequency, and cleaning;Milk Storage Reason for Pumping: engorged Pumping frequency: prn Pumped volume: 60 mL  Interventions Interventions: Skin to skin;Breast massage;Hand express;Position options;Expressed milk;DEBP;Ice;Adjust position;Breast compression  Discharge Discharge Education: Engorgement and breast care  Consult Status Consult Status: Complete Date: 08/23/22    Theodoro Kalata 08/23/2022, 5:21 AM

## 2022-08-23 NOTE — Lactation Note (Signed)
This note was copied from a baby's chart. Lactation Consultation Note  Patient Name: Boy Shaunie Boehm [id number] Date: 08/23/2022 Reason for consult: Follow-up assessment;RN request Age:34 hours  RN requested assistance since mother is filling and Rt breast engorged.  Encouraged mother to breastfeed first and gently massage breast.  After breastfeeding apply ice and massage toward clavicle and axilla.  If still uncomfortable pump for 5-7 min.  Do not attempt to repeatedly pump to empty breast because it will worsen engorgement.  Pump to comfort.  Monitor voids and stools. Feed on demand with cues.  Goal 8-12+ times per day after first 24 hrs.  Place baby STS if not cueing.  Mother has two DEBP's at home.   Maternal Data Has patient been taught Hand Expression?: Yes Does the patient have breastfeeding experience prior to this delivery?: Yes  Feeding Mother's Current Feeding Choice: Breast Milk Nipple Type: Nfant Slow Flow (purple)  LATCH Score Latch: Grasps breast easily, tongue down, lips flanged, rhythmical sucking.  Audible Swallowing: Spontaneous and intermittent  Type of Nipple: Everted at rest and after stimulation  Comfort (Breast/Nipple): Engorged, cracked, bleeding, large blisters, severe discomfort  Hold (Positioning): No assistance needed to correctly position infant at breast.  LATCH Score: 8   Lactation Tools Discussed/Used Tools: Pump Breast pump type: Double-Electric Breast Pump Pump Education: Setup, frequency, and cleaning;Milk Storage Reason for Pumping: engorged Pumping frequency: prn Pumped volume: 60 mL  Interventions Interventions: Education  Discharge Discharge Education: Engorgement and breast care Pump: Personal;DEBP  Consult Status Consult Status: Complete Date: 08/23/22    Vivianne Master Boschen 08/23/2022, 8:38 AM

## 2022-08-23 NOTE — Progress Notes (Addendum)
POSTPARTUM PROGRESS NOTE  Post Partum Day 2  Subjective:  Christine Taylor is a 36 y.o. [id number] s/p SOL at [redacted]w[redacted]d  No acute events overnight.  Pt denies problems with ambulating, voiding or po intake.  She denies nausea or vomiting.  Pain is well controlled.  She has had flatus. She has had bowel movement.  Lochia Minimal.   Objective: Blood pressure 117/65, pulse 83, temperature 98.6 F (37 C), temperature source Oral, resp. rate 18, height '5\' 2"'$  (1.575 m), weight 79.8 kg, last menstrual period 11/29/2021, SpO2 100 %, unknown if currently breastfeeding.  Physical Exam:  General: alert, cooperative and no distress Chest: no respiratory distress Heart:regular rate, distal pulses intact Abdomen: soft, nontender,  Uterine Fundus: firm, appropriately tender DVT Evaluation: No calf swelling or tenderness Extremities: no edema Skin: warm, dry  Recent Labs    08/20/22 2356 08/21/22 0526  HGB 9.9* 9.9*  HCT 27.8* 27.6*    Assessment/Plan: Christine Taylor a 36y.o. [id number]/p SOL at 351w6d PPD#2 - Doing well; BTL today scheduled Contraception: declined Feeding: breast; clogged ducts - using coconut oil and manual expression with hot packs Dispo: Plan for discharge post tubal today.   LOS: 2 days   LoDeloria LairDO, CNM 08/23/2022, 7:21 AM

## 2022-08-26 ENCOUNTER — Inpatient Hospital Stay (HOSPITAL_COMMUNITY): Admission: RE | Admit: 2022-08-26 | Payer: Medicaid Other | Source: Ambulatory Visit

## 2022-09-02 ENCOUNTER — Ambulatory Visit: Payer: Medicaid Other | Admitting: Obstetrics and Gynecology

## 2022-09-03 ENCOUNTER — Telehealth (HOSPITAL_COMMUNITY): Payer: Self-pay

## 2022-09-03 NOTE — Telephone Encounter (Addendum)
Patient reports feeling great. "I'm feeling stronger everyday." Patient declines questions/concerns about her health and healing.  Patient reports that baby is doing very well. Eating, peeing/pooping, and gaining weight well. Baby sleeps in a crib. RN reviewed ABC's of safe sleep with patient. Patient declines any questions or concerns about baby.  EPDS score is 0.  Sharyn Lull [facility]  09/03/22,1429

## 2022-09-17 ENCOUNTER — Encounter: Payer: Self-pay | Admitting: Obstetrics and Gynecology

## 2022-09-17 ENCOUNTER — Ambulatory Visit (INDEPENDENT_AMBULATORY_CARE_PROVIDER_SITE_OTHER): Payer: Commercial Managed Care - HMO | Admitting: Obstetrics and Gynecology

## 2022-09-17 DIAGNOSIS — Z30017 Encounter for initial prescription of implantable subdermal contraceptive: Secondary | ICD-10-CM | POA: Diagnosis not present

## 2022-09-17 LAB — POCT URINE PREGNANCY: Preg Test, Ur: NEGATIVE

## 2022-09-17 MED ORDER — ETONOGESTREL 68 MG ~~LOC~~ IMPL
68.0000 mg | DRUG_IMPLANT | Freq: Once | SUBCUTANEOUS | Status: AC
  Administered 2022-09-17: 68 mg via SUBCUTANEOUS

## 2022-09-17 NOTE — Addendum Note (Signed)
Addended by: Lewie Loron D on: 09/17/2022 02:51 PM   Modules accepted: Orders

## 2022-09-17 NOTE — Progress Notes (Deleted)
36 yo P4 with recent vaginal delivery on 08/21/22

## 2022-09-17 NOTE — Progress Notes (Signed)
[place] Partum Visit Note  Christine Taylor is a 36 y.o. [id number] female who presents for a postpartum visit. She is 4 weeks postpartum following a normal spontaneous vaginal delivery.  I have fully reviewed the prenatal and intrapartum course. The delivery was at 37.6 gestational weeks.  Anesthesia: none. Postpartum course has been uncomplicated. Baby is doing well. Baby is feeding by breast. Bleeding no bleeding. Bowel function is normal. Bladder function is normal. Patient is not sexually active. Contraception method is abstinence. Postpartum depression screening: negative.   The pregnancy intention screening data noted above was reviewed. Potential methods of contraception were discussed. The patient elected to proceed with No data recorded.   Edinburgh Postnatal Depression Scale - 09/17/22 1353       Edinburgh Postnatal Depression Scale:  In the Past 7 Days   I have been able to laugh and see the funny side of things. 0    I have looked forward with enjoyment to things. 0    I have blamed myself unnecessarily when things went wrong. 0    I have been anxious or worried for no good reason. 0    I have felt scared or panicky for no good reason. 0    Things have been getting on top of me. 0    I have been so unhappy that I have had difficulty sleeping. 0    I have felt sad or miserable. 0    I have been so unhappy that I have been crying. 0    The thought of harming myself has occurred to me. 0    Edinburgh Postnatal Depression Scale Total 0             Health Maintenance Due  Topic Date Due   COVID-19 Vaccine (1) Never done       Review of Systems Pertinent items noted in HPI and remainder of comprehensive ROS otherwise negative.  Objective:  BP (!) 138/91   Pulse 69   Ht '5\' 3"'$  (1.6 m)   Wt 161 lb 6.4 oz (73.2 kg)   Breastfeeding Yes   BMI 28.59 kg/m    General:  alert, cooperative, and no distress   Breasts:  normal  Lungs: clear to auscultation  bilaterally  Heart:  regular rate and rhythm, S1, S2 normal, no murmur, click, rub or gallop  Abdomen: soft, non-tender; bowel sounds normal; no masses,  no organomegaly   Wound   Taylor exam:  not indicated       Assessment:    1. Routine postpartum follow-up  Normal postpartum exam.   Plan:   Essential components of care per ACOG recommendations:  1.  Mood and well being: Patient with negative depression screening today. Reviewed local resources for support.  - Patient tobacco use? No.   - hx of drug use? No.    2. Infant care and feeding:  -Patient currently breastmilk feeding? Yes. Reviewed importance of draining breast regularly to support lactation.  -Social determinants of health (SDOH) reviewed in EPIC. No concerns  3. Sexuality, contraception and birth spacing - Patient does not want a pregnancy in the next year.  Desired family size is 4 children.  - Reviewed reproductive life planning. Reviewed contraceptive methods based on pt preferences and effectiveness.  Patient desired Hormonal Implant today.   - Patient given informed consent, signed copy in the chart, time out was performed. Pregnancy test was negative. Appropriate time out taken.  Patient's left arm was prepped and draped  in the usual sterile fashion.. The ruler used to measure and mark insertion area.  Patient was prepped with alcohol swab and then injected with 3 cc of 1% lidocaine with epinephrine.  Patient was prepped with betadine, Nexplanon removed form packaging.  Device confirmed in needle, then inserted full length of needle and withdrawn per handbook instructions.  Patient insertion site covered with a band-aid and coband.   Minimal blood loss.  Patient tolerated the procedure well.     4. Sleep and fatigue -Encouraged family/partner/community support of 4 hrs of uninterrupted sleep to help with mood and fatigue  5. Physical Recovery  - Discussed patients delivery and complications. She describes her  labor as good. - Patient had a Vaginal, no problems at delivery. Patient had no laceration. Perineal healing reviewed. Patient expressed understanding - Patient has urinary incontinence? No. - Patient is safe to resume physical and sexual activity  6.  Health Maintenance - HM due items addressed Yes - Last pap smear  Diagnosis  Date Value Ref Range Status  11/22/2020   Final   - Negative for intraepithelial lesion or malignancy (NILM)   Pap smear not done at today's visit.  -Breast Cancer screening indicated? No.   7. Chronic Disease/Pregnancy Condition follow up: None  - PCP follow up  Mora Bellman, MD [facility]

## 2022-09-17 NOTE — Progress Notes (Deleted)
    [place] Partum Visit Note  Christine Taylor is a 36 y.o. [id number] female who presents for a postpartum visit. She is 4 weeks postpartum following a normal spontaneous vaginal delivery.  I have fully reviewed the prenatal and intrapartum course. The delivery was at *** gestational weeks.  Anesthesia: {anesthesia types:812}. Postpartum course has been ***. Baby is doing well***. Baby is feeding by both breast and bottle - Enfamil . Bleeding staining only. Bowel function is normal. Bladder function is normal. Patient is not sexually active. Contraception method is abstinence.   Postpartum depression screening: negative.   The pregnancy intention screening data noted above was reviewed. Potential methods of contraception were discussed. The patient elected to proceed with No data recorded.    Health Maintenance Due  Topic Date Due   COVID-19 Vaccine (1) Never done    {Common ambulatory SmartLinks:19316}  Review of Systems {ros; complete:30496}  Objective:  Ht '5\' 3"'$  (1.6 m)   Wt 161 lb 6.4 oz (73.2 kg)   BMI 28.59 kg/m    General:  {gen appearance:16600}   Breasts:  {desc; normal/abnormal/not indicated:14647}  Lungs: {lung exam:16931}  Heart:  {heart exam:5510}  Abdomen: {abdomen exam:16834}   Wound {Wound assessment:11097}  GU exam:  {desc; normal/abnormal/not indicated:14647}       Assessment:    1. Routine postpartum follow-up ***   *** postpartum exam.   Plan:   Essential components of care per ACOG recommendations:  1.  Mood and well being: Patient with {gen negative/positive:315881} depression screening today. Reviewed local resources for support.  - Patient tobacco use? {tobacco use:25506}  - hx of drug use? {yes/no:25505}    2. Infant care and feeding:  -Patient currently breastmilk feeding? {yes/no:25502}  -Social determinants of health (SDOH) reviewed in EPIC. No concerns***The following needs were identified***  3. Sexuality, contraception and  birth spacing - Patient {DOES_DOES QVZ:56387} want a pregnancy in the next year.  Desired family size is {NUMBER 1-10:22536} children.  - Reviewed reproductive life planning. Reviewed contraceptive methods based on pt preferences and effectiveness.  Patient desired {[employer] Methods:24109} today.   - Discussed birth spacing of 18 months  4. Sleep and fatigue -Encouraged family/partner/community support of 4 hrs of uninterrupted sleep to help with mood and fatigue  5. Physical Recovery  - Discussed patients delivery and complications. She describes her labor as {description:25511} - Patient had a {CHL AMB DELIVERY:212 061 2253}. Patient had a {laceration:25518} laceration. Perineal healing reviewed. Patient expressed understanding - Patient has urinary incontinence? {yes/no:25515} - Patient {ACTION; IS/IS FIE:33295188} safe to resume physical and sexual activity  6.  Health Maintenance - HM due items addressed {Yes or If no, why not?:20788} - Last pap smear  Diagnosis  Date Value Ref Range Status  11/22/2020   Final   - Negative for intraepithelial lesion or malignancy (NILM)   Pap smear {done:10129} at today's visit.  -Breast Cancer screening indicated? {indicated:25516}  7. Chronic Disease/Pregnancy Condition follow up: {Follow up:25499}  - PCP follow up  Lorin Mercy, [place] for [employer], [place]

## 2022-09-24 NOTE — Telephone Encounter (Signed)
PT STATES SHES 6 MONTHS PREG AND WANTS TO TRANSFER HER CARE. AWAITING MEDICAL RECORDS TO SCHEDULE PT
